# Patient Record
Sex: Male | Born: 1945 | Race: White | Hispanic: No | Marital: Married | Smoking: Former smoker
Health system: Southern US, Community
[De-identification: ages and names within clinical notes are randomized; demographics above are authoritative.]

## PROBLEM LIST (undated history)

## (undated) DIAGNOSIS — C801 Malignant (primary) neoplasm, unspecified: Secondary | ICD-10-CM

## (undated) DIAGNOSIS — H269 Unspecified cataract: Secondary | ICD-10-CM

## (undated) DIAGNOSIS — C189 Malignant neoplasm of colon, unspecified: Secondary | ICD-10-CM

## (undated) DIAGNOSIS — K469 Unspecified abdominal hernia without obstruction or gangrene: Secondary | ICD-10-CM

## (undated) DIAGNOSIS — C3490 Malignant neoplasm of unspecified part of unspecified bronchus or lung: Secondary | ICD-10-CM

## (undated) HISTORY — PX: TONSILLECTOMY: SHX5217

## (undated) HISTORY — PX: COLON SURGERY: SHX602

## (undated) HISTORY — DX: Unspecified cataract: H26.9

## (undated) HISTORY — PX: KNEE ARTHROSCOPY W/ MENISCAL REPAIR: SHX1877

## (undated) HISTORY — PX: COLOSTOMY: SHX63

## (undated) HISTORY — DX: Malignant neoplasm of colon, unspecified: C18.9

## (undated) HISTORY — PX: WISDOM TOOTH EXTRACTION: SHX21

## (undated) HISTORY — DX: Unspecified abdominal hernia without obstruction or gangrene: K46.9

## (undated) HISTORY — DX: Malignant neoplasm of unspecified part of unspecified bronchus or lung: C34.90

---
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
## 2001-08-03 HISTORY — PX: COLOSTOMY: SHX63

## 2001-08-03 HISTORY — PX: COLON SURGERY: SHX602

## 2001-08-18 ENCOUNTER — Ambulatory Visit (HOSPITAL_COMMUNITY): Admission: RE | Admit: 2001-08-18 | Discharge: 2001-08-18 | Payer: Self-pay | Admitting: Gastroenterology

## 2001-08-18 ENCOUNTER — Encounter: Payer: Self-pay | Admitting: Gastroenterology

## 2001-08-24 ENCOUNTER — Encounter: Payer: Self-pay | Admitting: General Surgery

## 2001-08-25 ENCOUNTER — Ambulatory Visit (HOSPITAL_COMMUNITY): Admission: RE | Admit: 2001-08-25 | Discharge: 2001-08-25 | Payer: Self-pay | Admitting: General Surgery

## 2004-06-20 ENCOUNTER — Ambulatory Visit: Payer: Self-pay | Admitting: Internal Medicine

## 2009-10-11 ENCOUNTER — Encounter: Payer: Self-pay | Admitting: Internal Medicine

## 2010-09-02 NOTE — Letter (Signed)
Summary: [facility]  [facility]   Imported By: Lanelle Bal 10/21/2009 14:06:46  _____________________________________________________________________  External Attachment:    Type:   Image     Comment:   External Document

## 2012-05-14 ENCOUNTER — Ambulatory Visit: Payer: BC Managed Care – PPO

## 2012-05-14 ENCOUNTER — Ambulatory Visit (INDEPENDENT_AMBULATORY_CARE_PROVIDER_SITE_OTHER): Payer: BC Managed Care – PPO | Admitting: Internal Medicine

## 2012-05-14 VITALS — BP 115/80 | HR 80 | Temp 97.9°F | Resp 18 | Wt 138.0 lb

## 2012-05-14 DIAGNOSIS — M545 Low back pain, unspecified: Secondary | ICD-10-CM

## 2012-05-14 DIAGNOSIS — M25552 Pain in left hip: Secondary | ICD-10-CM

## 2012-05-14 DIAGNOSIS — D649 Anemia, unspecified: Secondary | ICD-10-CM

## 2012-05-14 DIAGNOSIS — C189 Malignant neoplasm of colon, unspecified: Secondary | ICD-10-CM | POA: Insufficient documentation

## 2012-05-14 DIAGNOSIS — M25559 Pain in unspecified hip: Secondary | ICD-10-CM

## 2012-05-14 DIAGNOSIS — C3490 Malignant neoplasm of unspecified part of unspecified bronchus or lung: Secondary | ICD-10-CM | POA: Insufficient documentation

## 2012-05-14 LAB — POCT CBC
Granulocyte percent: 63.3 %G (ref 37–80)
HCT, POC: 42.8 % — AB (ref 43.5–53.7)
Hemoglobin: 13.1 g/dL — AB (ref 14.1–18.1)
Lymph, poc: 2.2 (ref 0.6–3.4)
MCH, POC: 29 pg (ref 27–31.2)
MCHC: 30.6 g/dL — AB (ref 31.8–35.4)
MCV: 94.8 fL (ref 80–97)
MID (cbc): 0.6 (ref 0–0.9)
MPV: 7.3 fL (ref 0–99.8)
POC Granulocyte: 4.8 (ref 2–6.9)
POC LYMPH PERCENT: 28.7 %L (ref 10–50)
POC MID %: 8 %M (ref 0–12)
Platelet Count, POC: 424 10*3/uL (ref 142–424)
RBC: 4.51 M/uL — AB (ref 4.69–6.13)
RDW, POC: 15.6 %
WBC: 7.6 10*3/uL (ref 4.6–10.2)

## 2012-05-14 LAB — COMPREHENSIVE METABOLIC PANEL
ALT: 19 U/L (ref 0–53)
AST: 18 U/L (ref 0–37)
Albumin: 4.2 g/dL (ref 3.5–5.2)
Alkaline Phosphatase: 41 U/L (ref 39–117)
BUN: 21 mg/dL (ref 6–23)
CO2: 31 mEq/L (ref 19–32)
Calcium: 9.5 mg/dL (ref 8.4–10.5)
Chloride: 102 mEq/L (ref 96–112)
Creat: 0.92 mg/dL (ref 0.50–1.35)
Glucose, Bld: 84 mg/dL (ref 70–99)
Potassium: 5 mEq/L (ref 3.5–5.3)
Sodium: 140 mEq/L (ref 135–145)
Total Bilirubin: 0.7 mg/dL (ref 0.3–1.2)
Total Protein: 6.7 g/dL (ref 6.0–8.3)

## 2012-05-14 LAB — POCT SEDIMENTATION RATE: POCT SED RATE: 12 mm/hr (ref 0–22)

## 2012-05-14 MED ORDER — CYCLOBENZAPRINE HCL 10 MG PO TABS: 10.0000 mg | ORAL_TABLET | Freq: Every day | ORAL | Status: DC

## 2012-05-14 MED ORDER — HYDROCODONE-ACETAMINOPHEN 5-325 MG PO TABS: ORAL_TABLET | ORAL | Status: DC

## 2012-05-14 NOTE — Progress Notes (Signed)
Subjective:    Patient ID: Jon Wallace, male    DOB: [birth year], 66 y.o.   MRN: [id number]  CC: 66 yo W M with history of colorectal cancer w/ lung mets, now c/o L hip pain with radiation down anterior leg.  HPI Pt. Woke up Tuesday 05/03/12 with anterior thigh pain.  Wed. 10/2 he was treated at [facility] with a 6 day taper of Prednisone and Tramadol.  The tramadol caused vomiting and vivid dreams and the prednisone did not help his pain.    He describes a constant aching pain in the L hip accompanied by occasional radiation down the leg.  The pain started radiating only down his anterior thigh, it has since progressed and travels down his anterior shin.  He rates the pain 5/10, worse with laying down and prevents him from sleeping.  It does not bother him much while standing at work.  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  He has also tried Advil and [employer] without success.  He suspects that playing with his 2 yo grandson over the past 6 weeks might have something to do with this problem, but he mentions his wife disagrees with him. No bowel or bladder symptoms.  PMHx: Colorectal Ca. W/Lung Mets.  (Colorectal Ca. 2003? tx with radiation/Sx/Chemo, Lung Mets 2011? tx w/ R? wedge resection) -His history is vague/he has a permanent colostomy Primary care is Dr. Dewaine Oats     Review of Systems Constitutional: Denies changes in weight, night sweats, temperature intolerance HEENT: Denies sinus congestion, changes in vision/hearing, sore throat Cardiovascular: Denies chest pain Respiratory: Denies SOB, wheezing, trouble breathing GI: Denies changes in urinary habits, pain with urination GU: Denies changes in bowel habits, pt has ostomy.   MSK: POSITIVE-as above/ Denies jt pain elsewhere.  Neuro: Denies weakness or sensory loss.     Objective:   Physical Exam General: Pleasant 66 yo M appears concerned but in good health and no acute distress. Vitals:  Filed Vitals:   05/14/12 0910  BP:  115/80  Pulse: 80  Temp: 97.9 F (36.6 C)  Resp: 18  Heart: regular rate Lungs: No acute respiratory distress, no audible wheezing Abdomen: ostomy present. MSK: Normal bulk and tone. LE strength 5/5 bilaterally.  BACK - no brusing, full ROM without pain, non-tender to palpation.  HIP: No visible deformity, no pain with SI compression bilaterally, symmetric/painless ROM.  L = Tender to palpation inferior to the iliac crest posteriorly with partial reproduction of ant thigh pain.    Neuro: Alert, oriented. CN II-XII grossly IT, LE reflexes 2/4 bilaterally, no sensory deficits.     [facility] reading (PRIMARY) by  Dr. 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 but no lytic lesions either  Results for orders placed in visit on 05/14/12  POCT CBC      Component Value Range   WBC 7.6  4.6 - 10.2 K/uL   Lymph, poc 2.2  0.6 - 3.4   POC LYMPH PERCENT 28.7  10 - 50 %L   MID (cbc) 0.6  0 - 0.9   POC MID % 8.0  0 - 12 %M   POC Granulocyte 4.8  2 - 6.9   Granulocyte percent 63.3  37 - 80 %G   RBC 4.51 (*) 4.69 - 6.13 M/uL   Hemoglobin 13.1 (*) 14.1 - 18.1 g/dL   HCT, POC 11.9 (*) 14.7 - 53.7 %  MCV 94.8  80 - 97 fL   MCH, POC 29.0  27 - 31.2 pg   MCHC 30.6 (*) 31.8 - 35.4 g/dL   RDW, POC 16.1     Platelet Count, POC 424  142 - 424 K/uL   MPV 7.3  0 - 99.8 fL  ESR=12   Assessment & Plan:  #1) Left posterior and lateral hip pain of uncertain etiology With his history it is certainly impaired as to rule out R/O Cancer Mets ( I discussed this with him) Check metabolic profile If all normal he will need further evaluation, at first by orthopedics Will send notes to Dr. Foy Guadalajara for his consideration #2 Mild normocytic anemia Fe TIBC B12 Folate Meds ordered this encounter  Medications  . cyclobenzaprine (FLEXERIL) 10 MG tablet    Sig: Take 1 tablet (10 mg total) by mouth at bedtime.     Dispense:  30 tablet    Refill:  0  . HYDROcodone-acetaminophen (NORCO) 5-325 MG per tablet    Sig: 1-2 at bedtime if needed for pain    Dispense:  30 tablet    Refill:  0        This is mainly to ensure that he gets some sleep during the time that we evaluate this problem  He'll also use 800 mg of ibuprofen every 8 hours when necessary  OV

## 2017-02-08 DIAGNOSIS — N529 Male erectile dysfunction, unspecified: Secondary | ICD-10-CM | POA: Diagnosis not present

## 2017-02-08 DIAGNOSIS — D649 Anemia, unspecified: Secondary | ICD-10-CM | POA: Diagnosis not present

## 2017-02-08 DIAGNOSIS — E7800 Pure hypercholesterolemia, unspecified: Secondary | ICD-10-CM | POA: Diagnosis not present

## 2017-02-08 DIAGNOSIS — E559 Vitamin D deficiency, unspecified: Secondary | ICD-10-CM | POA: Diagnosis not present

## 2017-02-19 DIAGNOSIS — Z808 Family history of malignant neoplasm of other organs or systems: Secondary | ICD-10-CM | POA: Diagnosis not present

## 2017-02-19 DIAGNOSIS — Z6821 Body mass index (BMI) 21.0-21.9, adult: Secondary | ICD-10-CM | POA: Diagnosis not present

## 2017-02-19 DIAGNOSIS — Z7982 Long term (current) use of aspirin: Secondary | ICD-10-CM | POA: Diagnosis not present

## 2017-02-19 DIAGNOSIS — Z923 Personal history of irradiation: Secondary | ICD-10-CM | POA: Diagnosis not present

## 2017-02-19 DIAGNOSIS — C20 Malignant neoplasm of rectum: Secondary | ICD-10-CM | POA: Diagnosis not present

## 2017-02-19 DIAGNOSIS — Z85048 Personal history of other malignant neoplasm of rectum, rectosigmoid junction, and anus: Secondary | ICD-10-CM | POA: Diagnosis not present

## 2017-02-19 DIAGNOSIS — Z8371 Family history of colonic polyps: Secondary | ICD-10-CM | POA: Diagnosis not present

## 2017-02-19 DIAGNOSIS — R911 Solitary pulmonary nodule: Secondary | ICD-10-CM | POA: Diagnosis not present

## 2017-02-19 DIAGNOSIS — Z9221 Personal history of antineoplastic chemotherapy: Secondary | ICD-10-CM | POA: Diagnosis not present

## 2017-02-19 DIAGNOSIS — Z933 Colostomy status: Secondary | ICD-10-CM | POA: Diagnosis not present

## 2017-02-19 DIAGNOSIS — Z08 Encounter for follow-up examination after completed treatment for malignant neoplasm: Secondary | ICD-10-CM | POA: Diagnosis not present

## 2017-02-19 DIAGNOSIS — Z885 Allergy status to narcotic agent status: Secondary | ICD-10-CM | POA: Diagnosis not present

## 2017-02-19 DIAGNOSIS — C7800 Secondary malignant neoplasm of unspecified lung: Secondary | ICD-10-CM | POA: Diagnosis not present

## 2017-02-19 DIAGNOSIS — Z87891 Personal history of nicotine dependence: Secondary | ICD-10-CM | POA: Diagnosis not present

## 2017-04-12 DIAGNOSIS — Z23 Encounter for immunization: Secondary | ICD-10-CM | POA: Diagnosis not present

## 2017-08-16 DIAGNOSIS — R6882 Decreased libido: Secondary | ICD-10-CM | POA: Diagnosis not present

## 2017-08-16 DIAGNOSIS — E7800 Pure hypercholesterolemia, unspecified: Secondary | ICD-10-CM | POA: Diagnosis not present

## 2017-08-16 DIAGNOSIS — D500 Iron deficiency anemia secondary to blood loss (chronic): Secondary | ICD-10-CM | POA: Diagnosis not present

## 2017-08-16 DIAGNOSIS — Z933 Colostomy status: Secondary | ICD-10-CM | POA: Diagnosis not present

## 2017-08-16 DIAGNOSIS — Z85038 Personal history of other malignant neoplasm of large intestine: Secondary | ICD-10-CM | POA: Diagnosis not present

## 2017-08-16 DIAGNOSIS — Z0000 Encounter for general adult medical examination without abnormal findings: Secondary | ICD-10-CM | POA: Diagnosis not present

## 2017-08-16 DIAGNOSIS — N529 Male erectile dysfunction, unspecified: Secondary | ICD-10-CM | POA: Diagnosis not present

## 2017-08-16 DIAGNOSIS — E559 Vitamin D deficiency, unspecified: Secondary | ICD-10-CM | POA: Diagnosis not present

## 2017-09-14 ENCOUNTER — Ambulatory Visit (INDEPENDENT_AMBULATORY_CARE_PROVIDER_SITE_OTHER): Payer: Medicare Other | Admitting: Psychiatry

## 2017-09-14 DIAGNOSIS — F411 Generalized anxiety disorder: Secondary | ICD-10-CM

## 2017-10-05 ENCOUNTER — Ambulatory Visit: Payer: Medicare Other | Admitting: Psychiatry

## 2017-10-06 ENCOUNTER — Ambulatory Visit: Payer: Medicare Other | Admitting: Psychiatry

## 2017-10-21 DIAGNOSIS — Z885 Allergy status to narcotic agent status: Secondary | ICD-10-CM | POA: Diagnosis not present

## 2017-10-21 DIAGNOSIS — C7889 Secondary malignant neoplasm of other digestive organs: Secondary | ICD-10-CM | POA: Diagnosis not present

## 2017-10-21 DIAGNOSIS — R911 Solitary pulmonary nodule: Secondary | ICD-10-CM | POA: Diagnosis not present

## 2017-10-21 DIAGNOSIS — Z933 Colostomy status: Secondary | ICD-10-CM | POA: Diagnosis not present

## 2017-10-21 DIAGNOSIS — Z8249 Family history of ischemic heart disease and other diseases of the circulatory system: Secondary | ICD-10-CM | POA: Diagnosis not present

## 2017-10-21 DIAGNOSIS — Z08 Encounter for follow-up examination after completed treatment for malignant neoplasm: Secondary | ICD-10-CM | POA: Diagnosis not present

## 2017-10-21 DIAGNOSIS — Z7982 Long term (current) use of aspirin: Secondary | ICD-10-CM | POA: Diagnosis not present

## 2017-10-21 DIAGNOSIS — R918 Other nonspecific abnormal finding of lung field: Secondary | ICD-10-CM | POA: Diagnosis not present

## 2017-10-21 DIAGNOSIS — Z8371 Family history of colonic polyps: Secondary | ICD-10-CM | POA: Diagnosis not present

## 2017-10-21 DIAGNOSIS — K56699 Other intestinal obstruction unspecified as to partial versus complete obstruction: Secondary | ICD-10-CM | POA: Diagnosis not present

## 2017-10-21 DIAGNOSIS — Z808 Family history of malignant neoplasm of other organs or systems: Secondary | ICD-10-CM | POA: Diagnosis not present

## 2017-10-21 DIAGNOSIS — Z9221 Personal history of antineoplastic chemotherapy: Secondary | ICD-10-CM | POA: Diagnosis not present

## 2017-10-21 DIAGNOSIS — Z923 Personal history of irradiation: Secondary | ICD-10-CM | POA: Diagnosis not present

## 2017-10-21 DIAGNOSIS — Z8719 Personal history of other diseases of the digestive system: Secondary | ICD-10-CM | POA: Diagnosis not present

## 2017-10-21 DIAGNOSIS — Z6821 Body mass index (BMI) 21.0-21.9, adult: Secondary | ICD-10-CM | POA: Diagnosis not present

## 2017-10-21 DIAGNOSIS — Z85048 Personal history of other malignant neoplasm of rectum, rectosigmoid junction, and anus: Secondary | ICD-10-CM | POA: Diagnosis not present

## 2017-10-21 DIAGNOSIS — C20 Malignant neoplasm of rectum: Secondary | ICD-10-CM | POA: Diagnosis not present

## 2017-10-21 DIAGNOSIS — Z85118 Personal history of other malignant neoplasm of bronchus and lung: Secondary | ICD-10-CM | POA: Diagnosis not present

## 2017-10-21 DIAGNOSIS — Z87891 Personal history of nicotine dependence: Secondary | ICD-10-CM | POA: Diagnosis not present

## 2017-10-21 DIAGNOSIS — Z902 Acquired absence of lung [part of]: Secondary | ICD-10-CM | POA: Diagnosis not present

## 2017-10-21 DIAGNOSIS — K56600 Partial intestinal obstruction, unspecified as to cause: Secondary | ICD-10-CM | POA: Diagnosis not present

## 2017-10-21 DIAGNOSIS — C7800 Secondary malignant neoplasm of unspecified lung: Secondary | ICD-10-CM | POA: Diagnosis not present

## 2017-11-02 DIAGNOSIS — R05 Cough: Secondary | ICD-10-CM | POA: Diagnosis not present

## 2017-11-02 DIAGNOSIS — J069 Acute upper respiratory infection, unspecified: Secondary | ICD-10-CM | POA: Diagnosis not present

## 2017-11-18 DIAGNOSIS — J209 Acute bronchitis, unspecified: Secondary | ICD-10-CM | POA: Diagnosis not present

## 2017-11-18 DIAGNOSIS — J302 Other seasonal allergic rhinitis: Secondary | ICD-10-CM | POA: Diagnosis not present

## 2017-11-18 DIAGNOSIS — R05 Cough: Secondary | ICD-10-CM | POA: Diagnosis not present

## 2017-12-02 DIAGNOSIS — R05 Cough: Secondary | ICD-10-CM | POA: Diagnosis not present

## 2017-12-02 DIAGNOSIS — J302 Other seasonal allergic rhinitis: Secondary | ICD-10-CM | POA: Diagnosis not present

## 2017-12-02 DIAGNOSIS — J209 Acute bronchitis, unspecified: Secondary | ICD-10-CM | POA: Diagnosis not present

## 2017-12-04 DIAGNOSIS — Z7982 Long term (current) use of aspirin: Secondary | ICD-10-CM | POA: Diagnosis not present

## 2017-12-04 DIAGNOSIS — K56600 Partial intestinal obstruction, unspecified as to cause: Secondary | ICD-10-CM | POA: Diagnosis not present

## 2017-12-04 DIAGNOSIS — Z85048 Personal history of other malignant neoplasm of rectum, rectosigmoid junction, and anus: Secondary | ICD-10-CM | POA: Diagnosis not present

## 2017-12-04 DIAGNOSIS — M545 Low back pain: Secondary | ICD-10-CM | POA: Diagnosis not present

## 2017-12-04 DIAGNOSIS — Z933 Colostomy status: Secondary | ICD-10-CM | POA: Diagnosis not present

## 2017-12-04 DIAGNOSIS — C20 Malignant neoplasm of rectum: Secondary | ICD-10-CM | POA: Diagnosis not present

## 2017-12-04 DIAGNOSIS — R1031 Right lower quadrant pain: Secondary | ICD-10-CM | POA: Diagnosis not present

## 2017-12-04 DIAGNOSIS — K5651 Intestinal adhesions [bands], with partial obstruction: Secondary | ICD-10-CM | POA: Diagnosis present

## 2017-12-04 DIAGNOSIS — Z6820 Body mass index (BMI) 20.0-20.9, adult: Secondary | ICD-10-CM | POA: Diagnosis not present

## 2017-12-04 DIAGNOSIS — R1033 Periumbilical pain: Secondary | ICD-10-CM | POA: Diagnosis not present

## 2017-12-04 DIAGNOSIS — Z87891 Personal history of nicotine dependence: Secondary | ICD-10-CM | POA: Diagnosis not present

## 2017-12-04 DIAGNOSIS — C7800 Secondary malignant neoplasm of unspecified lung: Secondary | ICD-10-CM | POA: Diagnosis not present

## 2017-12-04 DIAGNOSIS — Z885 Allergy status to narcotic agent status: Secondary | ICD-10-CM | POA: Diagnosis not present

## 2017-12-04 DIAGNOSIS — Z85118 Personal history of other malignant neoplasm of bronchus and lung: Secondary | ICD-10-CM | POA: Diagnosis not present

## 2018-01-04 DIAGNOSIS — Z6820 Body mass index (BMI) 20.0-20.9, adult: Secondary | ICD-10-CM | POA: Diagnosis not present

## 2018-01-04 DIAGNOSIS — K56600 Partial intestinal obstruction, unspecified as to cause: Secondary | ICD-10-CM | POA: Diagnosis not present

## 2018-01-12 DIAGNOSIS — R933 Abnormal findings on diagnostic imaging of other parts of digestive tract: Secondary | ICD-10-CM | POA: Diagnosis not present

## 2018-01-12 DIAGNOSIS — Z85048 Personal history of other malignant neoplasm of rectum, rectosigmoid junction, and anus: Secondary | ICD-10-CM | POA: Diagnosis not present

## 2018-01-12 DIAGNOSIS — Z1211 Encounter for screening for malignant neoplasm of colon: Secondary | ICD-10-CM | POA: Diagnosis not present

## 2018-01-12 DIAGNOSIS — Z933 Colostomy status: Secondary | ICD-10-CM | POA: Diagnosis not present

## 2018-01-12 DIAGNOSIS — R112 Nausea with vomiting, unspecified: Secondary | ICD-10-CM | POA: Diagnosis not present

## 2018-01-12 DIAGNOSIS — Z7982 Long term (current) use of aspirin: Secondary | ICD-10-CM | POA: Diagnosis not present

## 2018-01-19 DIAGNOSIS — Z933 Colostomy status: Secondary | ICD-10-CM | POA: Diagnosis not present

## 2018-01-19 DIAGNOSIS — K5901 Slow transit constipation: Secondary | ICD-10-CM | POA: Diagnosis not present

## 2018-01-19 DIAGNOSIS — K56699 Other intestinal obstruction unspecified as to partial versus complete obstruction: Secondary | ICD-10-CM | POA: Diagnosis not present

## 2018-01-19 DIAGNOSIS — Z8719 Personal history of other diseases of the digestive system: Secondary | ICD-10-CM | POA: Diagnosis not present

## 2018-02-14 DIAGNOSIS — Z933 Colostomy status: Secondary | ICD-10-CM | POA: Diagnosis not present

## 2018-02-14 DIAGNOSIS — K5901 Slow transit constipation: Secondary | ICD-10-CM | POA: Diagnosis not present

## 2018-02-14 DIAGNOSIS — E559 Vitamin D deficiency, unspecified: Secondary | ICD-10-CM | POA: Diagnosis not present

## 2018-02-14 DIAGNOSIS — Z8719 Personal history of other diseases of the digestive system: Secondary | ICD-10-CM | POA: Diagnosis not present

## 2018-02-14 DIAGNOSIS — K56699 Other intestinal obstruction unspecified as to partial versus complete obstruction: Secondary | ICD-10-CM | POA: Diagnosis not present

## 2018-02-14 DIAGNOSIS — E7800 Pure hypercholesterolemia, unspecified: Secondary | ICD-10-CM | POA: Diagnosis not present

## 2018-03-15 DIAGNOSIS — Z6820 Body mass index (BMI) 20.0-20.9, adult: Secondary | ICD-10-CM | POA: Diagnosis not present

## 2018-03-15 DIAGNOSIS — K56600 Partial intestinal obstruction, unspecified as to cause: Secondary | ICD-10-CM | POA: Diagnosis not present

## 2018-04-20 DIAGNOSIS — Z79899 Other long term (current) drug therapy: Secondary | ICD-10-CM | POA: Diagnosis not present

## 2018-04-20 DIAGNOSIS — Z1211 Encounter for screening for malignant neoplasm of colon: Secondary | ICD-10-CM | POA: Diagnosis not present

## 2018-04-20 DIAGNOSIS — Z7982 Long term (current) use of aspirin: Secondary | ICD-10-CM | POA: Diagnosis not present

## 2018-04-20 DIAGNOSIS — D124 Benign neoplasm of descending colon: Secondary | ICD-10-CM | POA: Diagnosis not present

## 2018-04-20 DIAGNOSIS — Z85038 Personal history of other malignant neoplasm of large intestine: Secondary | ICD-10-CM | POA: Diagnosis not present

## 2018-04-20 DIAGNOSIS — Z85118 Personal history of other malignant neoplasm of bronchus and lung: Secondary | ICD-10-CM | POA: Diagnosis not present

## 2018-04-20 DIAGNOSIS — K635 Polyp of colon: Secondary | ICD-10-CM | POA: Diagnosis not present

## 2018-04-28 ENCOUNTER — Ambulatory Visit (INDEPENDENT_AMBULATORY_CARE_PROVIDER_SITE_OTHER): Payer: Medicare Other | Admitting: Psychology

## 2018-04-28 DIAGNOSIS — F419 Anxiety disorder, unspecified: Secondary | ICD-10-CM

## 2018-05-06 DIAGNOSIS — Z23 Encounter for immunization: Secondary | ICD-10-CM | POA: Diagnosis not present

## 2018-06-07 ENCOUNTER — Ambulatory Visit (INDEPENDENT_AMBULATORY_CARE_PROVIDER_SITE_OTHER): Payer: Medicare Other | Admitting: Psychology

## 2018-06-07 DIAGNOSIS — F419 Anxiety disorder, unspecified: Secondary | ICD-10-CM | POA: Diagnosis not present

## 2018-06-17 DIAGNOSIS — Z933 Colostomy status: Secondary | ICD-10-CM | POA: Diagnosis not present

## 2018-06-17 DIAGNOSIS — E559 Vitamin D deficiency, unspecified: Secondary | ICD-10-CM | POA: Diagnosis not present

## 2018-06-17 DIAGNOSIS — E7800 Pure hypercholesterolemia, unspecified: Secondary | ICD-10-CM | POA: Diagnosis not present

## 2018-06-17 DIAGNOSIS — Z8719 Personal history of other diseases of the digestive system: Secondary | ICD-10-CM | POA: Diagnosis not present

## 2018-06-17 DIAGNOSIS — K56699 Other intestinal obstruction unspecified as to partial versus complete obstruction: Secondary | ICD-10-CM | POA: Diagnosis not present

## 2018-06-17 DIAGNOSIS — K5901 Slow transit constipation: Secondary | ICD-10-CM | POA: Diagnosis not present

## 2018-06-21 ENCOUNTER — Ambulatory Visit (INDEPENDENT_AMBULATORY_CARE_PROVIDER_SITE_OTHER): Payer: Medicare Other | Admitting: Psychology

## 2018-06-21 DIAGNOSIS — F419 Anxiety disorder, unspecified: Secondary | ICD-10-CM | POA: Diagnosis not present

## 2018-07-05 ENCOUNTER — Ambulatory Visit (INDEPENDENT_AMBULATORY_CARE_PROVIDER_SITE_OTHER): Payer: Medicare Other | Admitting: Psychology

## 2018-07-05 DIAGNOSIS — F419 Anxiety disorder, unspecified: Secondary | ICD-10-CM | POA: Diagnosis not present

## 2018-08-18 DIAGNOSIS — Z0000 Encounter for general adult medical examination without abnormal findings: Secondary | ICD-10-CM | POA: Diagnosis not present

## 2018-08-18 DIAGNOSIS — Z8719 Personal history of other diseases of the digestive system: Secondary | ICD-10-CM | POA: Diagnosis not present

## 2018-08-18 DIAGNOSIS — K56699 Other intestinal obstruction unspecified as to partial versus complete obstruction: Secondary | ICD-10-CM | POA: Diagnosis not present

## 2018-08-18 DIAGNOSIS — Z933 Colostomy status: Secondary | ICD-10-CM | POA: Diagnosis not present

## 2018-08-18 DIAGNOSIS — K5901 Slow transit constipation: Secondary | ICD-10-CM | POA: Diagnosis not present

## 2018-08-18 DIAGNOSIS — D649 Anemia, unspecified: Secondary | ICD-10-CM | POA: Diagnosis not present

## 2018-08-18 DIAGNOSIS — E7800 Pure hypercholesterolemia, unspecified: Secondary | ICD-10-CM | POA: Diagnosis not present

## 2018-08-18 DIAGNOSIS — E559 Vitamin D deficiency, unspecified: Secondary | ICD-10-CM | POA: Diagnosis not present

## 2018-12-13 DIAGNOSIS — K56609 Unspecified intestinal obstruction, unspecified as to partial versus complete obstruction: Secondary | ICD-10-CM | POA: Diagnosis not present

## 2018-12-13 DIAGNOSIS — Z85038 Personal history of other malignant neoplasm of large intestine: Secondary | ICD-10-CM | POA: Diagnosis not present

## 2019-01-06 DIAGNOSIS — K56609 Unspecified intestinal obstruction, unspecified as to partial versus complete obstruction: Secondary | ICD-10-CM | POA: Diagnosis not present

## 2019-03-28 DIAGNOSIS — Z8719 Personal history of other diseases of the digestive system: Secondary | ICD-10-CM | POA: Insufficient documentation

## 2019-04-14 DIAGNOSIS — Z01812 Encounter for preprocedural laboratory examination: Secondary | ICD-10-CM | POA: Diagnosis not present

## 2019-04-14 DIAGNOSIS — Z8719 Personal history of other diseases of the digestive system: Secondary | ICD-10-CM | POA: Diagnosis not present

## 2019-04-14 DIAGNOSIS — Z20828 Contact with and (suspected) exposure to other viral communicable diseases: Secondary | ICD-10-CM | POA: Diagnosis not present

## 2019-04-20 DIAGNOSIS — K9409 Other complications of colostomy: Secondary | ICD-10-CM | POA: Diagnosis not present

## 2019-04-20 DIAGNOSIS — K5651 Intestinal adhesions [bands], with partial obstruction: Secondary | ICD-10-CM | POA: Diagnosis present

## 2019-04-20 DIAGNOSIS — K660 Peritoneal adhesions (postprocedural) (postinfection): Secondary | ICD-10-CM | POA: Diagnosis not present

## 2019-04-20 DIAGNOSIS — Z433 Encounter for attention to colostomy: Secondary | ICD-10-CM | POA: Diagnosis not present

## 2019-04-20 DIAGNOSIS — Z9889 Other specified postprocedural states: Secondary | ICD-10-CM | POA: Diagnosis not present

## 2019-04-20 DIAGNOSIS — Z85048 Personal history of other malignant neoplasm of rectum, rectosigmoid junction, and anus: Secondary | ICD-10-CM | POA: Diagnosis not present

## 2019-04-20 DIAGNOSIS — Z87891 Personal history of nicotine dependence: Secondary | ICD-10-CM | POA: Diagnosis not present

## 2019-04-20 DIAGNOSIS — K9403 Colostomy malfunction: Secondary | ICD-10-CM | POA: Diagnosis not present

## 2019-04-20 DIAGNOSIS — K5669 Other partial intestinal obstruction: Secondary | ICD-10-CM | POA: Diagnosis not present

## 2019-04-20 DIAGNOSIS — G8918 Other acute postprocedural pain: Secondary | ICD-10-CM | POA: Diagnosis not present

## 2019-04-20 DIAGNOSIS — Z8719 Personal history of other diseases of the digestive system: Secondary | ICD-10-CM | POA: Diagnosis not present

## 2019-04-23 MED ORDER — CVS EAR DROPS OT
40.00 | OTIC | Status: DC
Start: 2019-04-24 — End: 2019-04-23

## 2019-04-23 MED ORDER — OXYCODONE HCL 5 MG PO TABS
5.00 | ORAL_TABLET | ORAL | Status: DC
Start: ? — End: 2019-04-23

## 2019-04-23 MED ORDER — QUINERVA 260 MG PO TABS
650.00 | ORAL_TABLET | ORAL | Status: DC
Start: 2019-04-23 — End: 2019-04-23

## 2019-04-23 MED ORDER — TRAMADOL HCL 50 MG PO TABS
50.00 | ORAL_TABLET | ORAL | Status: DC
Start: ? — End: 2019-04-23

## 2019-04-23 MED ORDER — WRIST SPLINT/RIGHT YOUTH MISC
75.00 | Status: DC
Start: 2019-04-23 — End: 2019-04-23

## 2019-04-23 MED ORDER — EQUATE NICOTINE 4 MG MT GUM
4.00 | CHEWING_GUM | OROMUCOSAL | Status: DC
Start: ? — End: 2019-04-23

## 2019-05-04 DIAGNOSIS — Z9889 Other specified postprocedural states: Secondary | ICD-10-CM | POA: Diagnosis not present

## 2019-05-04 DIAGNOSIS — Z09 Encounter for follow-up examination after completed treatment for conditions other than malignant neoplasm: Secondary | ICD-10-CM | POA: Diagnosis not present

## 2019-05-04 DIAGNOSIS — D500 Iron deficiency anemia secondary to blood loss (chronic): Secondary | ICD-10-CM | POA: Diagnosis not present

## 2019-05-04 DIAGNOSIS — Z85038 Personal history of other malignant neoplasm of large intestine: Secondary | ICD-10-CM | POA: Diagnosis not present

## 2019-05-04 DIAGNOSIS — E559 Vitamin D deficiency, unspecified: Secondary | ICD-10-CM | POA: Diagnosis not present

## 2019-05-16 DIAGNOSIS — Z48815 Encounter for surgical aftercare following surgery on the digestive system: Secondary | ICD-10-CM | POA: Diagnosis not present

## 2019-08-04 HISTORY — PX: COLON SURGERY: SHX602

## 2019-08-24 DIAGNOSIS — D500 Iron deficiency anemia secondary to blood loss (chronic): Secondary | ICD-10-CM | POA: Diagnosis not present

## 2019-08-24 DIAGNOSIS — E559 Vitamin D deficiency, unspecified: Secondary | ICD-10-CM | POA: Diagnosis not present

## 2019-08-24 DIAGNOSIS — Z125 Encounter for screening for malignant neoplasm of prostate: Secondary | ICD-10-CM | POA: Diagnosis not present

## 2019-08-24 DIAGNOSIS — Z0000 Encounter for general adult medical examination without abnormal findings: Secondary | ICD-10-CM | POA: Diagnosis not present

## 2019-08-24 DIAGNOSIS — Z8719 Personal history of other diseases of the digestive system: Secondary | ICD-10-CM | POA: Diagnosis not present

## 2019-08-24 DIAGNOSIS — Z85038 Personal history of other malignant neoplasm of large intestine: Secondary | ICD-10-CM | POA: Diagnosis not present

## 2019-11-14 DIAGNOSIS — C20 Malignant neoplasm of rectum: Secondary | ICD-10-CM | POA: Diagnosis not present

## 2019-11-14 DIAGNOSIS — Z09 Encounter for follow-up examination after completed treatment for conditions other than malignant neoplasm: Secondary | ICD-10-CM | POA: Diagnosis not present

## 2019-11-14 DIAGNOSIS — Z8719 Personal history of other diseases of the digestive system: Secondary | ICD-10-CM | POA: Diagnosis not present

## 2019-11-14 DIAGNOSIS — Z85038 Personal history of other malignant neoplasm of large intestine: Secondary | ICD-10-CM | POA: Diagnosis not present

## 2019-11-14 DIAGNOSIS — C7800 Secondary malignant neoplasm of unspecified lung: Secondary | ICD-10-CM | POA: Diagnosis not present

## 2019-11-16 DIAGNOSIS — Z23 Encounter for immunization: Secondary | ICD-10-CM | POA: Diagnosis not present

## 2019-12-07 DIAGNOSIS — Z23 Encounter for immunization: Secondary | ICD-10-CM | POA: Diagnosis not present

## 2019-12-26 DIAGNOSIS — H04123 Dry eye syndrome of bilateral lacrimal glands: Secondary | ICD-10-CM | POA: Diagnosis not present

## 2019-12-26 DIAGNOSIS — H43811 Vitreous degeneration, right eye: Secondary | ICD-10-CM | POA: Diagnosis not present

## 2019-12-26 DIAGNOSIS — H2513 Age-related nuclear cataract, bilateral: Secondary | ICD-10-CM | POA: Diagnosis not present

## 2019-12-26 DIAGNOSIS — H10413 Chronic giant papillary conjunctivitis, bilateral: Secondary | ICD-10-CM | POA: Diagnosis not present

## 2019-12-26 DIAGNOSIS — H35371 Puckering of macula, right eye: Secondary | ICD-10-CM | POA: Diagnosis not present

## 2019-12-26 DIAGNOSIS — H353121 Nonexudative age-related macular degeneration, left eye, early dry stage: Secondary | ICD-10-CM | POA: Diagnosis not present

## 2019-12-26 DIAGNOSIS — H43392 Other vitreous opacities, left eye: Secondary | ICD-10-CM | POA: Diagnosis not present

## 2020-11-27 DIAGNOSIS — Z85048 Personal history of other malignant neoplasm of rectum, rectosigmoid junction, and anus: Secondary | ICD-10-CM | POA: Diagnosis not present

## 2020-11-29 DIAGNOSIS — Z03818 Encounter for observation for suspected exposure to other biological agents ruled out: Secondary | ICD-10-CM | POA: Diagnosis not present

## 2020-11-29 DIAGNOSIS — J069 Acute upper respiratory infection, unspecified: Secondary | ICD-10-CM | POA: Diagnosis not present

## 2020-12-10 DIAGNOSIS — Z1211 Encounter for screening for malignant neoplasm of colon: Secondary | ICD-10-CM | POA: Diagnosis not present

## 2020-12-10 DIAGNOSIS — K635 Polyp of colon: Secondary | ICD-10-CM | POA: Diagnosis not present

## 2020-12-10 DIAGNOSIS — Z01818 Encounter for other preprocedural examination: Secondary | ICD-10-CM | POA: Diagnosis not present

## 2020-12-13 DIAGNOSIS — K635 Polyp of colon: Secondary | ICD-10-CM | POA: Diagnosis not present

## 2020-12-26 ENCOUNTER — Encounter (INDEPENDENT_AMBULATORY_CARE_PROVIDER_SITE_OTHER): Payer: Self-pay

## 2020-12-26 DIAGNOSIS — H43392 Other vitreous opacities, left eye: Secondary | ICD-10-CM | POA: Diagnosis not present

## 2020-12-26 DIAGNOSIS — H04123 Dry eye syndrome of bilateral lacrimal glands: Secondary | ICD-10-CM | POA: Diagnosis not present

## 2020-12-26 DIAGNOSIS — H43811 Vitreous degeneration, right eye: Secondary | ICD-10-CM | POA: Diagnosis not present

## 2020-12-26 DIAGNOSIS — H10413 Chronic giant papillary conjunctivitis, bilateral: Secondary | ICD-10-CM | POA: Diagnosis not present

## 2020-12-26 DIAGNOSIS — H2513 Age-related nuclear cataract, bilateral: Secondary | ICD-10-CM | POA: Diagnosis not present

## 2020-12-26 DIAGNOSIS — H35371 Puckering of macula, right eye: Secondary | ICD-10-CM | POA: Diagnosis not present

## 2020-12-26 DIAGNOSIS — H353121 Nonexudative age-related macular degeneration, left eye, early dry stage: Secondary | ICD-10-CM | POA: Diagnosis not present

## 2021-01-07 ENCOUNTER — Other Ambulatory Visit: Payer: Self-pay

## 2021-01-07 ENCOUNTER — Ambulatory Visit (INDEPENDENT_AMBULATORY_CARE_PROVIDER_SITE_OTHER): Payer: Medicare Other | Admitting: Ophthalmology

## 2021-01-07 ENCOUNTER — Encounter (INDEPENDENT_AMBULATORY_CARE_PROVIDER_SITE_OTHER): Payer: Self-pay | Admitting: Ophthalmology

## 2021-01-07 DIAGNOSIS — H43812 Vitreous degeneration, left eye: Secondary | ICD-10-CM | POA: Insufficient documentation

## 2021-01-07 DIAGNOSIS — H35371 Puckering of macula, right eye: Secondary | ICD-10-CM | POA: Diagnosis not present

## 2021-01-07 DIAGNOSIS — H43813 Vitreous degeneration, bilateral: Secondary | ICD-10-CM | POA: Diagnosis not present

## 2021-01-07 DIAGNOSIS — H2512 Age-related nuclear cataract, left eye: Secondary | ICD-10-CM | POA: Insufficient documentation

## 2021-01-07 DIAGNOSIS — H2513 Age-related nuclear cataract, bilateral: Secondary | ICD-10-CM

## 2021-01-07 NOTE — Assessment & Plan Note (Signed)
Minor OU no retinal holes or tears, physiologic condition OU

## 2021-01-07 NOTE — Assessment & Plan Note (Signed)
Mild not visually significant cataract.

## 2021-01-07 NOTE — Assessment & Plan Note (Signed)
The nature of macular pucker (epiretinal membrane ERM) was discussed with the patient as well as threshold criteria for vitrectomy surgery. I explained that in rare cases another surgery is needed to actually remove a second wrinkle should it regrow.  Most often, the epiretinal membrane and underlying wrinkled internal limiting membrane are removed with the first surgery, to accomplish the goals.   If the operative eye is Phakic (natural lens still present), cataract surgery is often recommended prior to Vitrectomy. This will enable the retina surgeon to have the best view during surgery and the patient to obtain optimal results in the future. Treatment options were discussed.  Monocular vision testing discussed with the patient particularly to use reading materials in the monocular fashion to compare right eye versus left eye and to determine when or if visual acuity impact develops from this epiretinal membrane.  I did explain to the patient that should epiretinal membrane progress in the right eye or symptoms develop, it is most likely that cataract surgery in that eye will be required first to clear the visual axis and to determine the full impact of the epiretinal membrane on visual function moreover it would allow potential addressing the epiretinal membrane via surgical intervention should it be required based on his visual symptoms

## 2021-01-07 NOTE — Patient Instructions (Signed)
Macular Pucker  A macular pucker is scarring on the macula of the eye. The macula is the part of the eye that lets you see clearly and sharply. It also lets you see detail. The macula is in the middle of the thin membrane that covers the back of your eye (retina). A macular pucker usually starts in one eye but can affect both eyes over time. This condition can affect your vision. Vision changes can range from mild to severe. These vision changes usually do not get worse over time. What are the causes? This condition is caused by shrinking of the gel-like fluid that fills your eye (vitreous). The vitreous may shrink as you get older. As it shrinks, it pulls on your macula and forms scar tissue. What increases the risk? You are more likely to develop this condition if you are age 77 or older. Certain medical conditions can also increase your risk of macular pucker. These include:  A disease of the retina (retinopathy).  Injury or trauma to the eye.  Nearsightedness (myopia).  Tearing or detachment of the retina.  Inflammation of the eye (uveitis).  Diabetes. What are the signs or symptoms? Symptoms of this condition include:  Blurred vision. You may have trouble reading small print or seeing fine details.  Distorted vision. Straight lines may appear wavy.  A gray area or blind spot in the center of your vision. A mild pucker may cause no symptoms. How is this diagnosed? This condition is diagnosed based on your medical history and a physical exam. You may be referred to a health care provider who specializes in eye conditions (ophthalmologist) for an eye exam. You may also have tests, including tests that involve:  Checking the back of your eye with a microscope.  Injecting dye into the back of your eye before doing an exam.  Taking pictures of the back of your eye. How is this treated? Treatment is not needed for this condition unless your symptoms interfere with your daily  activities. Your eye care provider may change your prescription if you wear eyeglasses to help you see better. If treatment is needed, surgery is the only treatment for macular pucker.  This surgery (vitrectomy) is done only when macular pucker causes vision changes that interfere with your daily activities.  In this procedure, a surgeon removes any scar tissue and replaces your vitreous with a saltwater solution. Follow these instructions at home:  Be aware of any changes in your vision.  Keep all follow-up visits as told by your health care provider. This is important. Contact a health care provider if:  You have any new or worsening vision changes. Summary  A macular pucker is scarring on the macula of the eye.  This condition is caused by shrinking of the gel-like fluid that fills your eye.  A macular pucker can affect your vision. You may have trouble reading small print or seeing fine details.  Treatment is not needed unless the condition interferes with your daily activities. This information is not intended to replace advice given to you by your health care provider. Make sure you discuss any questions you have with your health care provider. Document Revised: 01/11/2018 Document Reviewed: 01/11/2018 Elsevier Patient Education  [place].

## 2021-01-07 NOTE — Progress Notes (Signed)
01/07/2021     CHIEF COMPLAINT Patient presents for Retina Evaluation (NP ERM OD - Ref'd by Dr. Herbie Baltimore Groat//Pt denies visual symptoms OU. Pt denies distortion OU. No complaints OU. Pt has not yet had cataract sx OU.)   HISTORY OF PRESENT ILLNESS: Jon Wallace is a 75 y.o. male who presents to the clinic today for:   HPI    Retina Evaluation    Laterality: right eye   Treatments tried: no treatments   Comments: NP ERM OD - Ref'd by Dr. Clent Jacks  Pt denies visual symptoms OU. Pt denies distortion OU. No complaints OU. Pt has not yet had cataract sx OU.       Last edited by Rockie Neighbours, [place] on 01/07/2021  8:46 AM. (History)      Referring physician: Briscoe Deutscher, MD [address],  [place] [address]  HISTORICAL INFORMATION:   Selected notes from the [facility]: No current outpatient medications on file. (Ophthalmic Drugs)   No current facility-administered medications for this visit. (Ophthalmic Drugs)   Current Outpatient Medications (Other)  Medication Sig  . cyclobenzaprine (FLEXERIL) 10 MG tablet Take 1 tablet (10 mg total) by mouth at bedtime.  Marland Kitchen HYDROcodone-acetaminophen (NORCO) 5-325 MG per tablet 1-2 at bedtime if needed for pain   No current facility-administered medications for this visit. (Other)      REVIEW OF SYSTEMS:    ALLERGIES Allergies  Allergen Reactions  . Morphine And Related     PAST MEDICAL HISTORY History reviewed. No pertinent past medical history. History reviewed. No pertinent surgical history.  FAMILY HISTORY History reviewed. No pertinent family history.  SOCIAL HISTORY Social History   Tobacco Use  . Smoking status: Former Research scientist (life sciences)  . Smokeless tobacco: Never Used         OPHTHALMIC EXAM:  Base Eye Exam    Visual Acuity (ETDRS)      Right Left   Dist cc 20/30 20/30 +2   Dist ph cc NI NI   Correction: Glasses       Tonometry (Tonopen, 8:51 AM)       Right Left   Pressure 13 13       Pupils      Pupils Dark Light Shape React APD   Right PERRL 4 3 Round Brisk None   Left PERRL 4 3 Round Brisk None       Visual Fields (Counting fingers)      Left Right    Full Full       Extraocular Movement      Right Left    Full Full       Neuro/Psych    Oriented x3: Yes   Mood/Affect: Normal       Dilation    Both eyes: 1.0% Mydriacyl, 2.5% Phenylephrine @ 8:51 AM        Slit Lamp and Fundus Exam    External Exam      Right Left   External Normal Normal       Slit Lamp Exam      Right Left   Lids/Lashes Normal Normal   Conjunctiva/Sclera White and quiet White and quiet   Cornea Clear Clear   Anterior Chamber Deep and quiet Deep and quiet   Iris Round and reactive Round and reactive   Lens 1.5+ Nuclear sclerosis 1.5+ Nuclear sclerosis   Anterior Vitreous Normal Normal  Fundus Exam      Right Left   Posterior Vitreous Posterior vitreous detachment Posterior vitreous detachment   Disc Normal Normal   C/D Ratio 0.4 0.4   Macula Epiretinal membrane, no topographic distortion, minor thickening Epiretinal membrane, no topographic distortion, minor thickening   Vessels Normal Normal   Periphery Normal Normal          IMAGING AND PROCEDURES  Imaging and Procedures for 01/07/21  OCT, Retina - OU - Both Eyes       Right Eye Quality was good. Scan locations included subfoveal. Central Foveal Thickness: 448. Progression has no prior data. Findings include abnormal foveal contour, epiretinal membrane.   Left Eye Quality was good. Scan locations included subfoveal. Central Foveal Thickness: 299. Progression has no prior data. Findings include normal foveal contour.   Notes Moderate to severe epiretinal membrane right eye with thickening however no CME secondary nor macular retina schisis.  OS normal contour   incidental posterior vitreous detachment OU                ASSESSMENT/PLAN:  Posterior  vitreous detachment of both eyes Minor OU no retinal holes or tears, physiologic condition OU  Nuclear sclerotic cataract of both eyes Mild not visually significant cataract.  Right epiretinal membrane The nature of macular pucker (epiretinal membrane ERM) was discussed with the patient as well as threshold criteria for vitrectomy surgery. I explained that in rare cases another surgery is needed to actually remove a second wrinkle should it regrow.  Most often, the epiretinal membrane and underlying wrinkled internal limiting membrane are removed with the first surgery, to accomplish the goals.   If the operative eye is Phakic (natural lens still present), cataract surgery is often recommended prior to Vitrectomy. This will enable the retina surgeon to have the best view during surgery and the patient to obtain optimal results in the future. Treatment options were discussed.  Monocular vision testing discussed with the patient particularly to use reading materials in the monocular fashion to compare right eye versus left eye and to determine when or if visual acuity impact develops from this epiretinal membrane.  I did explain to the patient that should epiretinal membrane progress in the right eye or symptoms develop, it is most likely that cataract surgery in that eye will be required first to clear the visual axis and to determine the full impact of the epiretinal membrane on visual function moreover it would allow potential addressing the epiretinal membrane via surgical intervention should it be required based on his visual symptoms      ICD-10-CM   1. Right epiretinal membrane  H35.371 OCT, Retina - OU - Both Eyes  2. Posterior vitreous detachment of both eyes  H43.813   3. Nuclear sclerotic cataract of both eyes  H25.13     1.  Patient instructed in home monitoring on a monocular basis.  He should do this monocular testing at least once a week with reading material similar from week to  week judging and comparing the right eye versus the left eye versus both eyes.  He is aware that testing of both eyes versus the right eye alone is not appropriate as the brain will simply pay attention to the better sided eye.  Most importantly he should compare the left eye versus the right eye looking for changes over time  2.  3.  Ophthalmic Meds Ordered this visit:  No orders of the defined types were placed in this encounter.  Return in about 6 months (around 07/09/2021) for dilate, OD, OCT.  Patient Instructions  Macular Pucker  A macular pucker is scarring on the macula of the eye. The macula is the part of the eye that lets you see clearly and sharply. It also lets you see detail. The macula is in the middle of the thin membrane that covers the back of your eye (retina). A macular pucker usually starts in one eye but can affect both eyes over time. This condition can affect your vision. Vision changes can range from mild to severe. These vision changes usually do not get worse over time. What are the causes? This condition is caused by shrinking of the gel-like fluid that fills your eye (vitreous). The vitreous may shrink as you get older. As it shrinks, it pulls on your macula and forms scar tissue. What increases the risk? You are more likely to develop this condition if you are age 14 or older. Certain medical conditions can also increase your risk of macular pucker. These include:  A disease of the retina (retinopathy).  Injury or trauma to the eye.  Nearsightedness (myopia).  Tearing or detachment of the retina.  Inflammation of the eye (uveitis).  Diabetes. What are the signs or symptoms? Symptoms of this condition include:  Blurred vision. You may have trouble reading small print or seeing fine details.  Distorted vision. Straight lines may appear wavy.  A gray area or blind spot in the center of your vision. A mild pucker may cause no symptoms. How is  this diagnosed? This condition is diagnosed based on your medical history and a physical exam. You may be referred to a health care provider who specializes in eye conditions (ophthalmologist) for an eye exam. You may also have tests, including tests that involve:  Checking the back of your eye with a microscope.  Injecting dye into the back of your eye before doing an exam.  Taking pictures of the back of your eye. How is this treated? Treatment is not needed for this condition unless your symptoms interfere with your daily activities. Your eye care provider may change your prescription if you wear eyeglasses to help you see better. If treatment is needed, surgery is the only treatment for macular pucker.  This surgery (vitrectomy) is done only when macular pucker causes vision changes that interfere with your daily activities.  In this procedure, a surgeon removes any scar tissue and replaces your vitreous with a saltwater solution. Follow these instructions at home:  Be aware of any changes in your vision.  Keep all follow-up visits as told by your health care provider. This is important. Contact a health care provider if:  You have any new or worsening vision changes. Summary  A macular pucker is scarring on the macula of the eye.  This condition is caused by shrinking of the gel-like fluid that fills your eye.  A macular pucker can affect your vision. You may have trouble reading small print or seeing fine details.  Treatment is not needed unless the condition interferes with your daily activities. This information is not intended to replace advice given to you by your health care provider. Make sure you discuss any questions you have with your health care provider. Document Revised: 01/11/2018 Document Reviewed: 01/11/2018 Elsevier Patient Education  2021 [place] the diagnoses, plan, and follow up with the patient and they expressed understanding.   Patient expressed understanding of the importance of proper  follow up care.   Clent Demark Tajia Szeliga M.D. Diseases & Surgery of the Retina and Vitreous [facility] [place] 01/07/21     Abbreviations: M myopia (nearsighted); A astigmatism; H hyperopia (farsighted); P presbyopia; Mrx spectacle prescription;  CTL contact lenses; OD right eye; OS left eye; OU both eyes  XT exotropia; ET esotropia; PEK punctate epithelial keratitis; PEE punctate epithelial erosions; DES dry eye syndrome; MGD meibomian gland dysfunction; ATs artificial tears; PFAT's preservative free artificial tears; [place] nuclear sclerotic cataract; PSC posterior subcapsular cataract; ERM epi-retinal membrane; PVD posterior vitreous detachment; RD retinal detachment; DM diabetes mellitus; DR diabetic retinopathy; NPDR non-proliferative diabetic retinopathy; PDR proliferative diabetic retinopathy; CSME clinically significant macular edema; DME diabetic macular edema; dbh dot blot hemorrhages; CWS cotton wool spot; POAG primary open angle glaucoma; C/D cup-to-disc ratio; HVF humphrey visual field; GVF goldmann visual field; OCT optical coherence tomography; IOP intraocular pressure; BRVO Branch retinal vein occlusion; CRVO central retinal vein occlusion; CRAO central retinal artery occlusion; BRAO branch retinal artery occlusion; RT retinal tear; SB scleral buckle; PPV pars plana vitrectomy; VH Vitreous hemorrhage; PRP panretinal laser photocoagulation; IVK intravitreal kenalog; VMT vitreomacular traction; MH Macular hole;  NVD neovascularization of the disc; NVE neovascularization elsewhere; AREDS age related eye disease study; ARMD age related macular degeneration; POAG primary open angle glaucoma; EBMD epithelial/anterior basement membrane dystrophy; ACIOL anterior chamber intraocular lens; IOL intraocular lens; PCIOL posterior chamber intraocular lens; Phaco/IOL phacoemulsification with intraocular lens placement; [place] photorefractive  keratectomy; LASIK laser assisted in situ keratomileusis; HTN hypertension; DM diabetes mellitus; COPD chronic obstructive pulmonary disease

## 2021-04-12 ENCOUNTER — Inpatient Hospital Stay (HOSPITAL_COMMUNITY): Payer: Medicare Other

## 2021-04-12 ENCOUNTER — Other Ambulatory Visit: Payer: Self-pay

## 2021-04-12 ENCOUNTER — Inpatient Hospital Stay (HOSPITAL_COMMUNITY)
Admission: EM | Admit: 2021-04-12 | Discharge: 2021-04-15 | DRG: 390 | Disposition: A | Discharge status: Home / Self Care | Payer: Medicare Other | Attending: Internal Medicine | Admitting: Internal Medicine

## 2021-04-12 ENCOUNTER — Encounter (HOSPITAL_COMMUNITY): Payer: Self-pay | Admitting: Emergency Medicine

## 2021-04-12 ENCOUNTER — Emergency Department (HOSPITAL_COMMUNITY): Payer: Medicare Other

## 2021-04-12 DIAGNOSIS — K56609 Unspecified intestinal obstruction, unspecified as to partial versus complete obstruction: Secondary | ICD-10-CM | POA: Diagnosis present

## 2021-04-12 DIAGNOSIS — Z20822 Contact with and (suspected) exposure to covid-19: Secondary | ICD-10-CM | POA: Diagnosis present

## 2021-04-12 DIAGNOSIS — R109 Unspecified abdominal pain: Secondary | ICD-10-CM | POA: Diagnosis not present

## 2021-04-12 DIAGNOSIS — Z9221 Personal history of antineoplastic chemotherapy: Secondary | ICD-10-CM

## 2021-04-12 DIAGNOSIS — Z885 Allergy status to narcotic agent status: Secondary | ICD-10-CM

## 2021-04-12 DIAGNOSIS — R9431 Abnormal electrocardiogram [ECG] [EKG]: Secondary | ICD-10-CM | POA: Diagnosis not present

## 2021-04-12 DIAGNOSIS — K5651 Intestinal adhesions [bands], with partial obstruction: Secondary | ICD-10-CM | POA: Diagnosis not present

## 2021-04-12 DIAGNOSIS — Z4682 Encounter for fitting and adjustment of non-vascular catheter: Secondary | ICD-10-CM | POA: Diagnosis not present

## 2021-04-12 DIAGNOSIS — E860 Dehydration: Secondary | ICD-10-CM | POA: Diagnosis present

## 2021-04-12 DIAGNOSIS — R1084 Generalized abdominal pain: Secondary | ICD-10-CM | POA: Diagnosis not present

## 2021-04-12 DIAGNOSIS — Z85118 Personal history of other malignant neoplasm of bronchus and lung: Secondary | ICD-10-CM | POA: Diagnosis not present

## 2021-04-12 DIAGNOSIS — Z933 Colostomy status: Secondary | ICD-10-CM | POA: Diagnosis not present

## 2021-04-12 DIAGNOSIS — R030 Elevated blood-pressure reading, without diagnosis of hypertension: Secondary | ICD-10-CM

## 2021-04-12 DIAGNOSIS — R112 Nausea with vomiting, unspecified: Secondary | ICD-10-CM

## 2021-04-12 DIAGNOSIS — R1030 Lower abdominal pain, unspecified: Secondary | ICD-10-CM | POA: Diagnosis not present

## 2021-04-12 DIAGNOSIS — Z85048 Personal history of other malignant neoplasm of rectum, rectosigmoid junction, and anus: Secondary | ICD-10-CM

## 2021-04-12 DIAGNOSIS — Z23 Encounter for immunization: Secondary | ICD-10-CM

## 2021-04-12 DIAGNOSIS — K5669 Other partial intestinal obstruction: Secondary | ICD-10-CM | POA: Diagnosis not present

## 2021-04-12 DIAGNOSIS — Z923 Personal history of irradiation: Secondary | ICD-10-CM | POA: Diagnosis not present

## 2021-04-12 DIAGNOSIS — Z87891 Personal history of nicotine dependence: Secondary | ICD-10-CM

## 2021-04-12 DIAGNOSIS — Z0189 Encounter for other specified special examinations: Secondary | ICD-10-CM

## 2021-04-12 DIAGNOSIS — Z902 Acquired absence of lung [part of]: Secondary | ICD-10-CM | POA: Diagnosis not present

## 2021-04-12 DIAGNOSIS — Z9049 Acquired absence of other specified parts of digestive tract: Secondary | ICD-10-CM | POA: Diagnosis not present

## 2021-04-12 HISTORY — DX: Malignant (primary) neoplasm, unspecified: C80.1

## 2021-04-12 LAB — URINALYSIS, MICROSCOPIC (REFLEX): Squamous Epithelial / HPF: NONE SEEN (ref 0–5)

## 2021-04-12 LAB — URINALYSIS, ROUTINE W REFLEX MICROSCOPIC
Bilirubin Urine: NEGATIVE
Glucose, UA: NEGATIVE mg/dL
Hgb urine dipstick: NEGATIVE
Ketones, ur: 40 mg/dL — AB
Leukocytes,Ua: NEGATIVE
Nitrite: NEGATIVE
Protein, ur: 30 mg/dL — AB
Specific Gravity, Urine: 1.01 (ref 1.005–1.030)
pH: 8.5 — ABNORMAL HIGH (ref 5.0–8.0)

## 2021-04-12 LAB — RESP PANEL BY RT-PCR (FLU A&B, COVID) ARPGX2
Influenza A by PCR: NEGATIVE
Influenza B by PCR: NEGATIVE
SARS Coronavirus 2 by RT PCR: NEGATIVE

## 2021-04-12 LAB — CBC
HCT: 37.3 % — ABNORMAL LOW (ref 39.0–52.0)
Hemoglobin: 12.5 g/dL — ABNORMAL LOW (ref 13.0–17.0)
MCH: 29.6 pg (ref 26.0–34.0)
MCHC: 33.5 g/dL (ref 30.0–36.0)
MCV: 88.2 fL (ref 80.0–100.0)
Platelets: 398 10*3/uL (ref 150–400)
RBC: 4.23 MIL/uL (ref 4.22–5.81)
RDW: 14 % (ref 11.5–15.5)
WBC: 7.9 10*3/uL (ref 4.0–10.5)
nRBC: 0 % (ref 0.0–0.2)

## 2021-04-12 LAB — COMPREHENSIVE METABOLIC PANEL
ALT: 14 U/L (ref 0–44)
AST: 25 U/L (ref 15–41)
Albumin: 4.1 g/dL (ref 3.5–5.0)
Alkaline Phosphatase: 42 U/L (ref 38–126)
Anion gap: 13 (ref 5–15)
BUN: 17 mg/dL (ref 8–23)
CO2: 23 mmol/L (ref 22–32)
Calcium: 9.8 mg/dL (ref 8.9–10.3)
Chloride: 101 mmol/L (ref 98–111)
Creatinine, Ser: 1.2 mg/dL (ref 0.61–1.24)
GFR, Estimated: >60 mL/min (ref >60)
Glucose, Bld: 131 mg/dL — ABNORMAL HIGH (ref 70–99)
Potassium: 4.1 mmol/L (ref 3.5–5.1)
Sodium: 137 mmol/L (ref 135–145)
Total Bilirubin: 1.1 mg/dL (ref 0.3–1.2)
Total Protein: 7.5 g/dL (ref 6.5–8.1)

## 2021-04-12 LAB — LIPASE, BLOOD: Lipase: 31 U/L (ref 11–51)

## 2021-04-12 MED ORDER — ONDANSETRON HCL 4 MG/2ML IJ SOLN
4.0000 mg | Freq: Once | INTRAMUSCULAR | Status: AC
  Administered 2021-04-12: 4 mg via INTRAVENOUS
  Filled 2021-04-12: qty 2

## 2021-04-12 MED ORDER — ENOXAPARIN SODIUM 40 MG/0.4ML IJ SOSY
40.0000 mg | PREFILLED_SYRINGE | INTRAMUSCULAR | Status: DC
  Administered 2021-04-12 – 2021-04-14 (×3): 40 mg via SUBCUTANEOUS
  Filled 2021-04-12 (×3): qty 0.4

## 2021-04-12 MED ORDER — DIATRIZOATE MEGLUMINE & SODIUM 66-10 % PO SOLN
90.0000 mL | Freq: Once | ORAL | Status: AC
  Administered 2021-04-12: 90 mL via NASOGASTRIC
  Filled 2021-04-12 (×2): qty 90

## 2021-04-12 MED ORDER — SODIUM CHLORIDE 0.9 % IV BOLUS
1000.0000 mL | Freq: Once | INTRAVENOUS | Status: AC
  Administered 2021-04-12: 1000 mL via INTRAVENOUS

## 2021-04-12 MED ORDER — HYDROMORPHONE HCL 1 MG/ML IJ SOLN
0.5000 mg | INTRAMUSCULAR | Status: DC | PRN
Start: 2021-04-12 — End: 2021-04-15
  Administered 2021-04-12 – 2021-04-13 (×4): 0.5 mg via INTRAVENOUS
  Filled 2021-04-12 (×4): qty 0.5

## 2021-04-12 MED ORDER — ONDANSETRON HCL 4 MG/2ML IJ SOLN
4.0000 mg | Freq: Four times a day (QID) | INTRAMUSCULAR | Status: DC | PRN
  Administered 2021-04-12 – 2021-04-13 (×3): 4 mg via INTRAVENOUS
  Filled 2021-04-12 (×3): qty 2

## 2021-04-12 MED ORDER — INFLUENZA VAC A&B SA ADJ QUAD 0.5 ML IM PRSY
0.5000 mL | PREFILLED_SYRINGE | INTRAMUSCULAR | Status: AC
  Administered 2021-04-13: 0.5 mL via INTRAMUSCULAR
  Filled 2021-04-12: qty 0.5

## 2021-04-12 MED ORDER — FENTANYL CITRATE PF 50 MCG/ML IJ SOSY: 25.0000 ug | PREFILLED_SYRINGE | INTRAMUSCULAR | Status: DC | PRN

## 2021-04-12 MED ORDER — DEXTROSE-NACL 5-0.9 % IV SOLN: INTRAVENOUS | Status: DC

## 2021-04-12 MED ORDER — HYDROMORPHONE HCL 1 MG/ML IJ SOLN
0.5000 mg | Freq: Once | INTRAMUSCULAR | Status: AC
  Administered 2021-04-12: 0.5 mg via INTRAVENOUS
  Filled 2021-04-12: qty 1

## 2021-04-12 MED ORDER — IOHEXOL 350 MG/ML SOLN
80.0000 mL | Freq: Once | INTRAVENOUS | Status: AC | PRN
  Administered 2021-04-12: 80 mL via INTRAVENOUS

## 2021-04-12 MED ORDER — SODIUM CHLORIDE 0.9 % IV SOLN: INTRAVENOUS | Status: DC

## 2021-04-12 NOTE — Consult Note (Signed)
CC: Abdominal pain with nausea and vomiting  Requesting provider: Dr. Deno Etienne  HPI: This is a 75 year old gentleman with a previous history of rectal cancer who is status post an APR in 2003.  He has had multiple chronic bowel obstructions.  He underwent a diagnostic laparoscopy with lysis of adhesions and colostomy revision at [facility] in 2020.  He still had intermittent chronic abdominal complaints since then.  Yesterday, he developed abdominal pain with nausea vomiting.  He had decreased ostomy output.  He does present to the emergency department.  His daughter reports he had a similar episode last weekend.  Currently his nausea has improved with medication and he has minimal abdominal pain.  He denies fever or chills.  He has no chest pain or shortness of breath.  Past Medical History:  Diagnosis Date   Cancer [facility])    Colon 2003 and Lung 2011    History reviewed. No pertinent surgical history.  No family history on file.  Social:  reports that he has quit smoking. He has never used smokeless tobacco. No history on file for alcohol use and drug use.  Allergies:  Allergies  Allergen Reactions   Morphine And Related     Medications: I have reviewed the patient's current medications.  Results for orders placed or performed during the hospital encounter of 04/12/21 (from the past 48 hour(s))  Lipase, blood     Status: None   Collection Time: 04/12/21  6:58 AM  Result Value Ref Range   Lipase 31 11 - 51 U/L    Comment: Performed at [facility], [place] [address] [place], [place] [address]  Comprehensive metabolic panel     Status: Abnormal   Collection Time: 04/12/21  6:58 AM  Result Value Ref Range   Sodium 137 135 - 145 mmol/L   Potassium 4.1 3.5 - 5.1 mmol/L   Chloride 101 98 - 111 mmol/L   CO2 23 22 - 32 mmol/L   Glucose, Bld 131 (H) 70 - 99 mg/dL    Comment: Glucose reference range applies only to samples taken after fasting  for at least 8 hours.   BUN 17 8 - 23 mg/dL   Creatinine, Ser 1.20 0.61 - 1.24 mg/dL   Calcium 9.8 8.9 - 10.3 mg/dL   Total Protein 7.5 6.5 - 8.1 g/dL   Albumin 4.1 3.5 - 5.0 g/dL   AST 25 15 - 41 U/L   ALT 14 0 - 44 U/L   Alkaline Phosphatase 42 38 - 126 U/L   Total Bilirubin 1.1 0.3 - 1.2 mg/dL   GFR, Estimated >60 >60 mL/min    Comment: (NOTE) Calculated using the CKD-EPI Creatinine Equation (2021)    Anion gap 13 5 - 15    Comment: Performed at [facility], [place] [address], [place], [place] [address]  CBC     Status: Abnormal   Collection Time: 04/12/21  6:58 AM  Result Value Ref Range   WBC 7.9 4.0 - 10.5 K/uL   RBC 4.23 4.22 - 5.81 MIL/uL   Hemoglobin 12.5 (L) 13.0 - 17.0 g/dL   HCT 37.3 (L) 39.0 - 52.0 %   MCV 88.2 80.0 - 100.0 fL   MCH 29.6 26.0 - 34.0 pg   MCHC 33.5 30.0 - 36.0 g/dL   RDW 14.0 11.5 - 15.5 %   Platelets 398 150 - 400 K/uL   nRBC 0.0 0.0 - 0.2 %    Comment:  Performed at [facility], [place] [address], [place], [place] [address]  Urinalysis, Routine w reflex microscopic Urine, Clean Catch     Status: Abnormal   Collection Time: 04/12/21  7:56 AM  Result Value Ref Range   Color, Urine YELLOW YELLOW   APPearance CLEAR CLEAR   Specific Gravity, Urine 1.010 1.005 - 1.030   pH 8.5 (H) 5.0 - 8.0   Glucose, UA NEGATIVE NEGATIVE mg/dL   Hgb urine dipstick NEGATIVE NEGATIVE   Bilirubin Urine NEGATIVE NEGATIVE   Ketones, ur 40 (A) NEGATIVE mg/dL   Protein, ur 30 (A) NEGATIVE mg/dL   Nitrite NEGATIVE NEGATIVE   Leukocytes,Ua NEGATIVE NEGATIVE    Comment: Performed at [place] [address], [place], [place] [address]  Urinalysis, Microscopic (reflex)     Status: Abnormal   Collection Time: 04/12/21  7:56 AM  Result Value Ref Range   RBC / HPF 0-5 0 - 5 RBC/hpf   WBC, UA 0-5 0 - 5 WBC/hpf   Bacteria, UA RARE (A) NONE SEEN   Squamous Epithelial / LPF NONE SEEN 0 - 5    Comment: Performed at  [facility], [place] [address] [place], [place] [address]    CT ABDOMEN PELVIS W CONTRAST  Result Date: 04/12/2021 CLINICAL DATA:  Abdominal pain and nausea since yesterday. History of bowel obstruction. Colon cancer 19 years ago. EXAM: CT ABDOMEN AND PELVIS WITH CONTRAST TECHNIQUE: Multidetector CT imaging of the abdomen and pelvis was performed using the standard protocol following bolus administration of intravenous contrast. CONTRAST:  28mL OMNIPAQUE IOHEXOL 350 MG/ML SOLN COMPARISON:  Abdominopelvic CT report of 08/18/2001. FINDINGS: Lower chest: Right lower lobe wedge resection with surrounding scarring. Normal heart size without pericardial or pleural effusion. Hepatobiliary: Focal steatosis adjacent the falciform ligament. No suspicious liver lesion. Normal gallbladder, without biliary ductal dilatation. Pancreas: Normal, without mass or ductal dilatation. Spleen: Normal in size, without focal abnormality. Adrenals/Urinary Tract: Normal adrenal glands. 1.3 cm lower pole right renal cyst. Normal left kidney. No hydronephrosis. Normal urinary bladder. Stomach/Bowel: Normal stomach, without wall thickening. Status post descending colostomy and abdominal perineal resection for rectal cancer. The remaining colon is partially decompressed. Normal terminal ileum. Mid small bowel loops are mildly dilated at up to 3.2 cm, fluid-filled. Undergo a gradual transition to normal caliber distal small bowel. A segment of collapsed bowel within the dilated portion including on 56/2 demonstrates apparent wall thickening. There is edema and mesenteric fluid involving the dilated small bowel, without specific evidence of complicating ischemia. Vascular/Lymphatic: Aortic atherosclerosis. No abdominopelvic adenopathy. Reproductive: Normal prostate. Other: No free intraperitoneal air. Small volume pelvic fluid including on 64/2. Presacral soft tissue thickening is likely treatment related. No evidence of  omental or peritoneal disease. Musculoskeletal: Osteopenia. Degenerate disc disease at the lumbosacral junction. IMPRESSION: 1. Status post abdominal perineal resection for rectal cancer. 2. Mid small bowel dilatation, mild, favoring partial small bowel obstruction, most likely secondary to adhesions. 3. Short segment area of underdistention and apparent wall thickening within the dilated small bowel could represent concurrent enteritis. Adjacent surrounding mesenteric edema is nonspecific. No specific evidence of complicating ischemia. 4. Trace pelvic fluid, likely secondary. 5. No findings of metastatic disease. Electronically Signed   By: Abigail Miyamoto M.D.   On: 04/12/2021 09:09    ROS - all of the below systems have been reviewed with the patient and positives are indicated with bold text General: chills, fever or night sweats Eyes: blurry vision or double vision  ENT: epistaxis or sore throat Allergy/Immunology: itchy/watery eyes or nasal congestion Hematologic/Lymphatic: bleeding problems, blood clots or swollen lymph nodes Endocrine: temperature intolerance or unexpected weight changes Breast: new or changing breast lumps or nipple discharge Resp: cough, shortness of breath, or wheezing CV: chest pain or dyspnea on exertion GI: as per HPI GU: dysuria, trouble voiding, or hematuria MSK: joint pain or joint stiffness Neuro: TIA or stroke symptoms Derm: pruritus and skin lesion changes Psych: anxiety and depression  PE Blood pressure 121/86, pulse 77, temperature 98 F (36.7 C), temperature source Oral, resp. rate 18, SpO2 96 %. Constitutional: NAD; conversant; no deformities Eyes: Moist conjunctiva; no lid lag; anicteric; PERRL Neck: Trachea midline; no thyromegaly Lungs: Normal respiratory effort; no tactile fremitus CV: RRR; no palpable thrills; no pitting edema GI: Abd soft and nondistended.  The ostomy is pink and well perfused with some liquid in the bag.  There are no obvious  hernias; no palpable hepatosplenomegaly MSK: Normal range of motion of extremities; no clubbing/cyanosis Psychiatric: Appropriate affect; alert and oriented x3   Results for orders placed or performed during the hospital encounter of 04/12/21 (from the past 48 hour(s))  Lipase, blood     Status: None   Collection Time: 04/12/21  6:58 AM  Result Value Ref Range   Lipase 31 11 - 51 U/L    Comment: Performed at [facility], [place] [address], [place], [place] [address]  Comprehensive metabolic panel     Status: Abnormal   Collection Time: 04/12/21  6:58 AM  Result Value Ref Range   Sodium 137 135 - 145 mmol/L   Potassium 4.1 3.5 - 5.1 mmol/L   Chloride 101 98 - 111 mmol/L   CO2 23 22 - 32 mmol/L   Glucose, Bld 131 (H) 70 - 99 mg/dL    Comment: Glucose reference range applies only to samples taken after fasting for at least 8 hours.   BUN 17 8 - 23 mg/dL   Creatinine, Ser 1.20 0.61 - 1.24 mg/dL   Calcium 9.8 8.9 - 10.3 mg/dL   Total Protein 7.5 6.5 - 8.1 g/dL   Albumin 4.1 3.5 - 5.0 g/dL   AST 25 15 - 41 U/L   ALT 14 0 - 44 U/L   Alkaline Phosphatase 42 38 - 126 U/L   Total Bilirubin 1.1 0.3 - 1.2 mg/dL   GFR, Estimated >60 >60 mL/min    Comment: (NOTE) Calculated using the CKD-EPI Creatinine Equation (2021)    Anion gap 13 5 - 15    Comment: Performed at [facility], [place] [address] [place], [place] [address]  CBC     Status: Abnormal   Collection Time: 04/12/21  6:58 AM  Result Value Ref Range   WBC 7.9 4.0 - 10.5 K/uL   RBC 4.23 4.22 - 5.81 MIL/uL   Hemoglobin 12.5 (L) 13.0 - 17.0 g/dL   HCT 37.3 (L) 39.0 - 52.0 %   MCV 88.2 80.0 - 100.0 fL   MCH 29.6 26.0 - 34.0 pg   MCHC 33.5 30.0 - 36.0 g/dL   RDW 14.0 11.5 - 15.5 %   Platelets 398 150 - 400 K/uL   nRBC 0.0 0.0 - 0.2 %    Comment: Performed at [facility], [place] [address], [place], [place] [address]  Urinalysis, Routine w reflex microscopic Urine, Clean Catch      Status: Abnormal   Collection Time: 04/12/21  7:56 AM  Result Value Ref Range  Color, Urine YELLOW YELLOW   APPearance CLEAR CLEAR   Specific Gravity, Urine 1.010 1.005 - 1.030   pH 8.5 (H) 5.0 - 8.0   Glucose, UA NEGATIVE NEGATIVE mg/dL   Hgb urine dipstick NEGATIVE NEGATIVE   Bilirubin Urine NEGATIVE NEGATIVE   Ketones, ur 40 (A) NEGATIVE mg/dL   Protein, ur 30 (A) NEGATIVE mg/dL   Nitrite NEGATIVE NEGATIVE   Leukocytes,Ua NEGATIVE NEGATIVE    Comment: Performed at [place] [address], [place], [place] [address]  Urinalysis, Microscopic (reflex)     Status: Abnormal   Collection Time: 04/12/21  7:56 AM  Result Value Ref Range   RBC / HPF 0-5 0 - 5 RBC/hpf   WBC, UA 0-5 0 - 5 WBC/hpf   Bacteria, UA RARE (A) NONE SEEN   Squamous Epithelial / LPF NONE SEEN 0 - 5    Comment: Performed at [facility], [place] [address] [place], [place] [address]    CT ABDOMEN PELVIS W CONTRAST  Result Date: 04/12/2021 CLINICAL DATA:  Abdominal pain and nausea since yesterday. History of bowel obstruction. Colon cancer 19 years ago. EXAM: CT ABDOMEN AND PELVIS WITH CONTRAST TECHNIQUE: Multidetector CT imaging of the abdomen and pelvis was performed using the standard protocol following bolus administration of intravenous contrast. CONTRAST:  92mL OMNIPAQUE IOHEXOL 350 MG/ML SOLN COMPARISON:  Abdominopelvic CT report of 08/18/2001. FINDINGS: Lower chest: Right lower lobe wedge resection with surrounding scarring. Normal heart size without pericardial or pleural effusion. Hepatobiliary: Focal steatosis adjacent the falciform ligament. No suspicious liver lesion. Normal gallbladder, without biliary ductal dilatation. Pancreas: Normal, without mass or ductal dilatation. Spleen: Normal in size, without focal abnormality. Adrenals/Urinary Tract: Normal adrenal glands. 1.3 cm lower pole right renal cyst. Normal left kidney. No hydronephrosis. Normal urinary bladder.  Stomach/Bowel: Normal stomach, without wall thickening. Status post descending colostomy and abdominal perineal resection for rectal cancer. The remaining colon is partially decompressed. Normal terminal ileum. Mid small bowel loops are mildly dilated at up to 3.2 cm, fluid-filled. Undergo a gradual transition to normal caliber distal small bowel. A segment of collapsed bowel within the dilated portion including on 56/2 demonstrates apparent wall thickening. There is edema and mesenteric fluid involving the dilated small bowel, without specific evidence of complicating ischemia. Vascular/Lymphatic: Aortic atherosclerosis. No abdominopelvic adenopathy. Reproductive: Normal prostate. Other: No free intraperitoneal air. Small volume pelvic fluid including on 64/2. Presacral soft tissue thickening is likely treatment related. No evidence of omental or peritoneal disease. Musculoskeletal: Osteopenia. Degenerate disc disease at the lumbosacral junction. IMPRESSION: 1. Status post abdominal perineal resection for rectal cancer. 2. Mid small bowel dilatation, mild, favoring partial small bowel obstruction, most likely secondary to adhesions. 3. Short segment area of underdistention and apparent wall thickening within the dilated small bowel could represent concurrent enteritis. Adjacent surrounding mesenteric edema is nonspecific. No specific evidence of complicating ischemia. 4. Trace pelvic fluid, likely secondary. 5. No findings of metastatic disease. Electronically Signed   By: Abigail Miyamoto M.D.   On: 04/12/2021 09:09     A/P: Partial small bowel obstruction  I have reviewed his CT scan of the abdomen pelvis.  I have also reviewed his notes in care everywhere including the operative report from his last surgery in 2020.  Again, he is mild obstruction is likely secondary to adhesions.  I discussed this with him and his daughter in detail.  Given the edema and wall thickening in the 1 loop of small bowel I would  recommend nasogastric suctioning, bowel rest, and continued IV rehydration.  Once he has a nasogastric tube in place we will also order the small bowel protocol.  We will follow him with you.  Coralie Keens, MD [facility] Use [email or web address] to contact on call provider

## 2021-04-12 NOTE — H&P (Signed)
History and Physical    Jon Wallace ZDG:[id number] DOB: January 29, 1946 DOA: 04/12/2021  PCP: Briscoe Deutscher, MD Patient coming from: Home  Chief Complaint: Abdominal pain and vomiting  HPI: CONG HIGHTOWER is a 75 y.o. male with history of colorectal cancer s/p resection, chemoradiation and colostomy in 2003 and right lung cancer s/p resection in 2011 and recurrent SBO presenting with cramping abdominal pain and vomiting.  Patient reports cramping abdominal pain and emesis since 4 PM yesterday.  Rates his pain 10/10 at the severity.  Pain improved after IV Dilaudid in ED.  Currently 6/10.  Pain is mainly across lower abdomen.  Pain is constant although it eases off at times.  Reports emesis almost every 15 to 20 minutes.  Emesis was nonbloody.  Also noted small output from ostomy yesterday.  No output from ostomy today.  Not sure about fever but reports feeling hot.  Denies URI symptoms, chest pain or dyspnea.  He denies UTI symptoms.    He reports recurrent small bowel obstruction.  He says he had bowel obstruction last night with similar symptoms.  At that time, he did not come to the hospital.  His symptoms resolved after 2 days.   Patient denies smoking cigarette, drinking alcohol recreational drug use.  Prefers to remain full code.  In ED, BP slightly elevated.  Other vitals within normal.  CBC, CMP and UA without significant finding.  CT abdomen and pelvis raises concern for SBO and possible enteritis.  General surgery consulted.  NG tube inserted.  Hospitalist service called for admission.  ROS All review of system negative except for pertinent positives and negatives as history of present illness above.  PMH Past Medical History:  Diagnosis Date   Cancer [facility])    Colon 2003 and Lung 2011   [place] History reviewed. No pertinent surgical history. Fam HX No family history on file.  Social Hx  reports that he has quit smoking. He has never used smokeless tobacco. No history on file  for alcohol use and drug use.  Allergy Allergies  Allergen Reactions   Morphine And Related    Home Meds Prior to Admission medications   Medication Sig Start Date End Date Taking? Authorizing Provider  cyclobenzaprine (FLEXERIL) 10 MG tablet Take 1 tablet (10 mg total) by mouth at bedtime. 05/14/12   Leandrew Koyanagi, MD  HYDROcodone-acetaminophen [facility]) 5-325 MG per tablet 1-2 at bedtime if needed for pain 05/14/12   Leandrew Koyanagi, MD    Physical Exam: Vitals:   04/12/21 0645 04/12/21 0730 04/12/21 0915 04/12/21 1045  BP: (!) 161/92 (!) 153/86 121/86 (!) 154/92  Pulse: 88 71 77 70  Resp: 18 16 18 11   Temp: 98 F (36.7 C)     TempSrc: Oral     SpO2: 99% 99% 96% 96%    GENERAL: No acute distress.  Appears well.  HEENT: MMM.  Vision and hearing grossly intact.  NECK: Supple.  No apparent JVD.  RESP:  No IWOB. Good air movement bilaterally. CVS:  RRR. Heart sounds normal.  ABD/GI/GU: Bowel sounds present. Soft.  Diffuse tenderness but no rebound or guarding. Greenish fluid (order removal) in ostomy bag. MSK/EXT:  Moves extremities. No apparent deformity or edema.  SKIN: no apparent skin lesion or wound NEURO: Awake, alert and oriented appropriately.  No gross deficit.  PSYCH: Calm. Normal affect.   Personally Reviewed Radiological Exams CT ABDOMEN PELVIS W CONTRAST  Result Date: 04/12/2021 CLINICAL DATA:  Abdominal pain and nausea since  yesterday. History of bowel obstruction. Colon cancer 19 years ago. EXAM: CT ABDOMEN AND PELVIS WITH CONTRAST TECHNIQUE: Multidetector CT imaging of the abdomen and pelvis was performed using the standard protocol following bolus administration of intravenous contrast. CONTRAST:  29mL OMNIPAQUE IOHEXOL 350 MG/ML SOLN COMPARISON:  Abdominopelvic CT report of 08/18/2001. FINDINGS: Lower chest: Right lower lobe wedge resection with surrounding scarring. Normal heart size without pericardial or pleural effusion. Hepatobiliary: Focal  steatosis adjacent the falciform ligament. No suspicious liver lesion. Normal gallbladder, without biliary ductal dilatation. Pancreas: Normal, without mass or ductal dilatation. Spleen: Normal in size, without focal abnormality. Adrenals/Urinary Tract: Normal adrenal glands. 1.3 cm lower pole right renal cyst. Normal left kidney. No hydronephrosis. Normal urinary bladder. Stomach/Bowel: Normal stomach, without wall thickening. Status post descending colostomy and abdominal perineal resection for rectal cancer. The remaining colon is partially decompressed. Normal terminal ileum. Mid small bowel loops are mildly dilated at up to 3.2 cm, fluid-filled. Undergo a gradual transition to normal caliber distal small bowel. A segment of collapsed bowel within the dilated portion including on 56/2 demonstrates apparent wall thickening. There is edema and mesenteric fluid involving the dilated small bowel, without specific evidence of complicating ischemia. Vascular/Lymphatic: Aortic atherosclerosis. No abdominopelvic adenopathy. Reproductive: Normal prostate. Other: No free intraperitoneal air. Small volume pelvic fluid including on 64/2. Presacral soft tissue thickening is likely treatment related. No evidence of omental or peritoneal disease. Musculoskeletal: Osteopenia. Degenerate disc disease at the lumbosacral junction. IMPRESSION: 1. Status post abdominal perineal resection for rectal cancer. 2. Mid small bowel dilatation, mild, favoring partial small bowel obstruction, most likely secondary to adhesions. 3. Short segment area of underdistention and apparent wall thickening within the dilated small bowel could represent concurrent enteritis. Adjacent surrounding mesenteric edema is nonspecific. No specific evidence of complicating ischemia. 4. Trace pelvic fluid, likely secondary. 5. No findings of metastatic disease. Electronically Signed   By: Abigail Miyamoto M.D.   On: 04/12/2021 09:09     Personally Reviewed  Labs: CBC: Recent Labs  Lab 04/12/21 0658  WBC 7.9  HGB 12.5*  HCT 37.3*  MCV 88.2  PLT 094   Basic Metabolic Panel: Recent Labs  Lab 04/12/21 0658  NA 137  K 4.1  CL 101  CO2 23  GLUCOSE 131*  BUN 17  CREATININE 1.20  CALCIUM 9.8   GFR: CrCl cannot be calculated (Unknown ideal weight.). Liver Function Tests: Recent Labs  Lab 04/12/21 0658  AST 25  ALT 14  ALKPHOS 42  BILITOT 1.1  PROT 7.5  ALBUMIN 4.1   Recent Labs  Lab 04/12/21 0658  LIPASE 31   No results for input(s): AMMONIA in the last 168 hours. Coagulation Profile: No results for input(s): INR, PROTIME in the last 168 hours. Cardiac Enzymes: No results for input(s): CKTOTAL, CKMB, CKMBINDEX, TROPONINI in the last 168 hours. BNP (last 3 results) No results for input(s): PROBNP in the last 8760 hours. HbA1C: No results for input(s): HGBA1C in the last 72 hours. CBG: No results for input(s): GLUCAP in the last 168 hours. Lipid Profile: No results for input(s): CHOL, HDL, LDLCALC, TRIG, CHOLHDL, LDLDIRECT in the last 72 hours. Thyroid Function Tests: No results for input(s): TSH, T4TOTAL, FREET4, T3FREE, THYROIDAB in the last 72 hours. Anemia Panel: No results for input(s): VITAMINB12, FOLATE, FERRITIN, TIBC, IRON, RETICCTPCT in the last 72 hours. Urine analysis:    Component Value Date/Time   COLORURINE YELLOW 04/12/2021 [place] 04/12/2021 0756   LABSPEC 1.010 04/12/2021 0756  PHURINE 8.5 (H) 04/12/2021 0756   GLUCOSEU NEGATIVE 04/12/2021 0756   HGBUR NEGATIVE 04/12/2021 0756   BILIRUBINUR NEGATIVE 04/12/2021 0756   KETONESUR 40 (A) 04/12/2021 0756   PROTEINUR 30 (A) 04/12/2021 0756   NITRITE NEGATIVE 04/12/2021 0756   LEUKOCYTESUR NEGATIVE 04/12/2021 0756    Sepsis Labs:  None  Personally Reviewed EKG:  None  Assessment/Plan Recurrent small bowel obstruction in patient with colostomy-presented with cramping abdominal pain and intractable nausea and vomiting CT  abdomen pelvis raises concern for SBO and possible enteritis.  Seems to be recurrent issue. -General surgery managing with NG tube decompression and SBO protocol -IV fluid, antiemetics and analgesics  Intractable nausea, vomiting and abdominal pain: Likely due to the above. -Management as above  History of colorectal cancer s/p resection, chemoradiation and colostomy in 2003 at [facility]  History of lung cancer status post right lower lobe resection in 2011-in remission  Elevated blood pressure-likely due to #1.  Improved.  DVT prophylaxis: Subcu Lovenox  Code Status: Full code Family Communication: Updated patient's daughter at bedside  Disposition Plan: Admit to MedSurg Consults called: General surgery Admission status: Inpatient Level of care: Med-Surg   Mercy Riding MD [facility]  If 7PM-7AM, please contact night-coverage [email or web address]  04/12/2021, 11:08 AM

## 2021-04-12 NOTE — ED Provider Notes (Signed)
[facility] DEPT Provider Note   CSN: [id number] Arrival date & time: 04/12/21  0631     History Chief Complaint  Patient presents with   Abdominal Pain   Nausea    Jon Wallace is a 75 y.o. male.  75 yo M with a cc of abdominal pain nausea and vomiting.  Feels like the last time he had a small bowel obstruction which he said was about a year ago.  At that time he had to have surgical revision.  The patient states that he does get episodes like this since the revision but usually last for few hours and the symptoms been lasting greater than 12.  No fevers or chills.  The history is provided by the patient.  Abdominal Pain Pain location:  Generalized Pain radiates to:  Does not radiate Pain severity:  Moderate Onset quality:  Gradual Duration:  2 days Timing:  Constant Progression:  Worsening Chronicity:  New Relieved by:  Nothing Worsened by:  Nothing Ineffective treatments:  None tried Associated symptoms: no chest pain, no chills, no diarrhea, no fever, no shortness of breath and no vomiting       Past Medical History:  Diagnosis Date   Cancer ([place])    Colon 2003 and Lung 2011    Patient Active Problem List   Diagnosis Date Noted   SBO (small bowel obstruction) ([place]) 04/12/2021   Right epiretinal membrane 01/07/2021   Posterior vitreous detachment of both eyes 01/07/2021   Nuclear sclerotic cataract of both eyes 01/07/2021   Colon cancer-2003? 05/14/2012   Lung cancer ([place]) 05/14/2012    History reviewed. No pertinent surgical history.     No family history on file.  Social History   Tobacco Use   Smoking status: Former   Smokeless tobacco: Never    Home Medications Prior to Admission medications   Medication Sig Start Date End Date Taking? Authorizing Provider  Simethicone (GAS-X PO) Take 1 capsule by mouth daily as needed (flatulence).   Yes [provider]  cyclobenzaprine (FLEXERIL) 10 MG tablet Take 1  tablet (10 mg total) by mouth at bedtime. Patient not taking: No sig reported 05/14/12   Leandrew Koyanagi, MD  HYDROcodone-acetaminophen [facility]) 5-325 MG per tablet 1-2 at bedtime if needed for pain Patient not taking: No sig reported 05/14/12   Leandrew Koyanagi, MD    Allergies    Morphine and related  Review of Systems   Review of Systems  Constitutional:  Negative for chills and fever.  HENT:  Negative for congestion and facial swelling.   Eyes:  Negative for discharge and visual disturbance.  Respiratory:  Negative for shortness of breath.   Cardiovascular:  Negative for chest pain and palpitations.  Gastrointestinal:  Positive for abdominal pain. Negative for diarrhea and vomiting.  Musculoskeletal:  Negative for arthralgias and myalgias.  Skin:  Negative for color change and rash.  Neurological:  Negative for tremors, syncope and headaches.  Psychiatric/Behavioral:  Negative for confusion and dysphoric mood.    Physical Exam Updated Vital Signs BP (!) 155/86 (BP Location: Left Arm)   Pulse 72   Temp 98.2 F (36.8 C) (Oral)   Resp 16   Ht 5\' 9"  (1.753 m)   Wt 63.5 kg   SpO2 97%   BMI 20.67 kg/m   Physical Exam Vitals and nursing note reviewed.  Constitutional:      Appearance: He is well-developed.  HENT:     Head: Normocephalic and atraumatic.  Eyes:     Pupils: Pupils are equal, round, and reactive to light.  Neck:     Vascular: No JVD.  Cardiovascular:     Rate and Rhythm: Normal rate and regular rhythm.     Heart sounds: No murmur heard.   No friction rub. No gallop.  Pulmonary:     Effort: No respiratory distress.     Breath sounds: No wheezing.  Abdominal:     General: There is no distension.     Tenderness: There is abdominal tenderness. There is no guarding or rebound.     Comments: Mild diffuse abdominal discomfort.  Ostomy with bluish fluid  Musculoskeletal:        General: Normal range of motion.     Cervical back: Normal range of motion  and neck supple.  Skin:    Coloration: Skin is not pale.     Findings: No rash.  Neurological:     Mental Status: He is alert and oriented to person, place, and time.  Psychiatric:        Behavior: Behavior normal.    ED Results / Procedures / Treatments   Labs (all labs ordered are listed, but only abnormal results are displayed) Labs Reviewed  COMPREHENSIVE METABOLIC PANEL - Abnormal; Notable for the following components:      Result Value   Glucose, Bld 131 (*)    All other components within normal limits  CBC - Abnormal; Notable for the following components:   Hemoglobin 12.5 (*)    HCT 37.3 (*)    All other components within normal limits  URINALYSIS, ROUTINE W REFLEX MICROSCOPIC - Abnormal; Notable for the following components:   pH 8.5 (*)    Ketones, ur 40 (*)    Protein, ur 30 (*)    All other components within normal limits  URINALYSIS, MICROSCOPIC (REFLEX) - Abnormal; Notable for the following components:   Bacteria, UA RARE (*)    All other components within normal limits  RESP PANEL BY RT-PCR (FLU A&B, COVID) ARPGX2  LIPASE, BLOOD    EKG EKG Interpretation  Date/Time:  Saturday April 12 2021 07:26:45 EDT Ventricular Rate:  68 PR Interval:  153 QRS Duration: 91 QT Interval:  392 QTC Calculation: 417 R Axis:   78 Text Interpretation: Sinus rhythm Atrial premature complex No significant change since last tracing Confirmed by Deno Etienne [phone]) on 04/12/2021 7:47:21 AM  Radiology CT ABDOMEN PELVIS W CONTRAST  Result Date: 04/12/2021 CLINICAL DATA:  Abdominal pain and nausea since yesterday. History of bowel obstruction. Colon cancer 19 years ago. EXAM: CT ABDOMEN AND PELVIS WITH CONTRAST TECHNIQUE: Multidetector CT imaging of the abdomen and pelvis was performed using the standard protocol following bolus administration of intravenous contrast. CONTRAST:  3mL OMNIPAQUE IOHEXOL 350 MG/ML SOLN COMPARISON:  Abdominopelvic CT report of 08/18/2001. FINDINGS:  Lower chest: Right lower lobe wedge resection with surrounding scarring. Normal heart size without pericardial or pleural effusion. Hepatobiliary: Focal steatosis adjacent the falciform ligament. No suspicious liver lesion. Normal gallbladder, without biliary ductal dilatation. Pancreas: Normal, without mass or ductal dilatation. Spleen: Normal in size, without focal abnormality. Adrenals/Urinary Tract: Normal adrenal glands. 1.3 cm lower pole right renal cyst. Normal left kidney. No hydronephrosis. Normal urinary bladder. Stomach/Bowel: Normal stomach, without wall thickening. Status post descending colostomy and abdominal perineal resection for rectal cancer. The remaining colon is partially decompressed. Normal terminal ileum. Mid small bowel loops are mildly dilated at up to 3.2 cm, fluid-filled. Undergo a gradual transition to normal  caliber distal small bowel. A segment of collapsed bowel within the dilated portion including on 56/2 demonstrates apparent wall thickening. There is edema and mesenteric fluid involving the dilated small bowel, without specific evidence of complicating ischemia. Vascular/Lymphatic: Aortic atherosclerosis. No abdominopelvic adenopathy. Reproductive: Normal prostate. Other: No free intraperitoneal air. Small volume pelvic fluid including on 64/2. Presacral soft tissue thickening is likely treatment related. No evidence of omental or peritoneal disease. Musculoskeletal: Osteopenia. Degenerate disc disease at the lumbosacral junction. IMPRESSION: 1. Status post abdominal perineal resection for rectal cancer. 2. Mid small bowel dilatation, mild, favoring partial small bowel obstruction, most likely secondary to adhesions. 3. Short segment area of underdistention and apparent wall thickening within the dilated small bowel could represent concurrent enteritis. Adjacent surrounding mesenteric edema is nonspecific. No specific evidence of complicating ischemia. 4. Trace pelvic fluid,  likely secondary. 5. No findings of metastatic disease. Electronically Signed   By: Abigail Miyamoto M.D.   On: 04/12/2021 09:09   DG Abd Portable 1V-Small Bowel Protocol-Position Verification  Result Date: 04/12/2021 CLINICAL DATA:  NG tube placement. EXAM: PORTABLE ABDOMEN - 1 VIEW COMPARISON:  None. FINDINGS: Limited view of the abdomen demonstrates enteric catheter overlying the left upper quadrant of the abdomen, with tip far laterally. Nonspecific bowel gas pattern. IMPRESSION: Enteric catheter overlies the left upper quadrant of the abdomen, with tip far laterally, possibly within the far lateral aspect of the gastric body. Electronically Signed   By: Fidela Salisbury M.D.   On: 04/12/2021 12:49    Procedures Procedures   Medications Ordered in ED Medications  diatrizoate meglumine-sodium (GASTROGRAFIN) 66-10 % solution 90 mL (has no administration in time range)  dextrose 5 %-0.9 % sodium chloride infusion ( Intravenous New Bag/Given 04/12/21 1353)  HYDROmorphone (DILAUDID) injection 0.5 mg (0.5 mg Intravenous Given 04/12/21 1348)  ondansetron (ZOFRAN) injection 4 mg (4 mg Intravenous Given 04/12/21 1347)  ondansetron (ZOFRAN) injection 4 mg (4 mg Intravenous Given 04/12/21 0735)  HYDROmorphone (DILAUDID) injection 0.5 mg (0.5 mg Intravenous Given 04/12/21 0737)  sodium chloride 0.9 % bolus 1,000 mL (1,000 mLs Intravenous New Bag/Given 04/12/21 0735)  iohexol (OMNIPAQUE) 350 MG/ML injection 80 mL (80 mLs Intravenous Contrast Given 04/12/21 0816)    ED Course  I have reviewed the triage vital signs and the nursing notes.  Pertinent labs & imaging results that were available during my care of the patient were reviewed by me and considered in my medical decision making (see chart for details).    MDM Rules/Calculators/A&P                           75 yo M with a cc of abdominal pain.  Going on for the past day.  Hx of obstruction and feels the same.  Labs, ct.   CT scan is concerning  for a partial small bowel obstruction.  LFTs and lipase are unremarkable.  No significant anemia.  Mild ketones in the urine consistent with dehydration.  Given a bolus of IV fluids pain and nausea medicine with some improvement.  I discussed the case with Dr. Ninfa Linden, general surgery recommended medical admission and they will come evaluate the patient at bedside.  The patients results and plan were reviewed and discussed.   Any x-rays performed were independently reviewed by myself.   Differential diagnosis were considered with the presenting HPI.  Medications  diatrizoate meglumine-sodium (GASTROGRAFIN) 66-10 % solution 90 mL (has no administration in time range)  dextrose 5 %-  0.9 % sodium chloride infusion ( Intravenous New Bag/Given 04/12/21 1353)  HYDROmorphone (DILAUDID) injection 0.5 mg (0.5 mg Intravenous Given 04/12/21 1348)  ondansetron (ZOFRAN) injection 4 mg (4 mg Intravenous Given 04/12/21 1347)  ondansetron (ZOFRAN) injection 4 mg (4 mg Intravenous Given 04/12/21 0735)  HYDROmorphone (DILAUDID) injection 0.5 mg (0.5 mg Intravenous Given 04/12/21 0737)  sodium chloride 0.9 % bolus 1,000 mL (1,000 mLs Intravenous New Bag/Given 04/12/21 0735)  iohexol (OMNIPAQUE) 350 MG/ML injection 80 mL (80 mLs Intravenous Contrast Given 04/12/21 0816)    Vitals:   04/12/21 1255 04/12/21 1258 04/12/21 1321 04/12/21 1406  BP: (!) 149/81  (!) 155/86   Pulse: 74  72   Resp: 14  16   Temp:  98.1 F (36.7 C) 98.2 F (36.8 C)   TempSrc:  Oral Oral   SpO2: 98%  97%   Weight:    63.5 kg  Height:    5\' 9"  (1.753 m)    Final diagnoses:  Encounter for imaging study to confirm nasogastric (NG) tube placement  Small bowel obstruction ([place])    Admission/ observation were discussed with the admitting physician, patient and/or family and they are comfortable with the plan.   Final Clinical Impression(s) / ED Diagnoses Final diagnoses:  Encounter for imaging study to confirm nasogastric (NG) tube  placement  Small bowel obstruction [facility])    Rx / DC Orders ED Discharge Orders     None        Deno Etienne, DO 04/12/21 1518

## 2021-04-12 NOTE — ED Triage Notes (Signed)
Patient here from home reporting abd pain, nausea since yesterday. Reports "nothing in colostomy bag in awhile". Hx of bowel obstruction.

## 2021-04-13 DIAGNOSIS — K56609 Unspecified intestinal obstruction, unspecified as to partial versus complete obstruction: Secondary | ICD-10-CM | POA: Diagnosis not present

## 2021-04-13 NOTE — Progress Notes (Signed)
PROGRESS NOTE  Jon Wallace  DOB: 09-06-1945  PCP: Briscoe Deutscher, MD KGM:[id number]  DOA: 04/12/2021  LOS: 1 day  Hospital Day: 2   Chief Complaint  Patient presents with   Abdominal Pain   Nausea    Brief narrative: MONTAVIOUS Wallace is a 75 y.o. male with PMH significant for colorectal cancer s/p resection, chemoradiation and colostomy in 2003 and right lung cancer s/p resection in 2011 and recurrent SBO. Patient presented to the ED on 9/10 with complaint of cramping abdominal pain, nausea, vomiting for several hours associated with only minimal output from ostomy.   In the ED, blood pressure was elevated to 161/92. Labs mostly unremarkable CT abdomen pelvis raised concern of small bowel obstruction and enteritis. General surgery consultation was called.  NG tube was inserted. Admitted to hospitalist service.  Subjective: Patient was seen and examined this morning.  Pleasant elderly Caucasian male.  Propped up in bed.  Not in distress.  Feels better after NG tube was taken out.  General surgery seems of started the patient on clear liquid diet. Daughter at bedside. Chart reviewed Hemodynamically stable No labs this morning  Assessment/Plan: Recurrence of small bowel obstruction  -Presented with cramping abdominal pain, vomiting, minimal output from ostomy.  -CT abdomen suggestive of enteritis and small bowel obstruction -Surgery consult appreciated.  Was initially started on NG tube to suction.  Subsequent abdominal x-ray showed improvement in bowel gas pattern.  Started on clear liquid diet this morning.  Continue to monitor.  Elevated blood pressure -Blood pressure was elevated to 161/92 on admission, probably due to acute pain..  No history of hypertension. -Not on scheduled medication.  Blood pressure spontaneously improving  History of colorectal cancer  -s/p resection, chemoradiation and colostomy in 2003 at [facility]   History of lung cancer  -s/p right  lower lobe resection in 2011-in remission    Mobility: Encourage ambulation Code Status:   Code Status: Full Code  Nutritional status: Body mass index is 20.67 kg/m.     Diet:  Diet Order             Diet clear liquid Room service appropriate? Yes; Fluid consistency: Thin  Diet effective now                  DVT prophylaxis:  enoxaparin (LOVENOX) injection 40 mg Start: 04/12/21 2000   Antimicrobials: None Fluid: Currently on D5 NS@125  mill per hour.  I will reduce it to 50 mill per hour. Consultants: General surgery Family Communication: Daughter at bedside  Status is: Inpatient  Remains inpatient appropriate because: Diet gradually being advanced  Dispo: The patient is from: Home              Anticipated d/c is to: Home likely tomorrow              Patient currently is not medically stable to d/c.   Difficult to place patient No     Infusions:   dextrose 5 % and 0.9% NaCl 125 mL/hr at 04/12/21 2200    Scheduled Meds:  enoxaparin (LOVENOX) injection  40 mg Subcutaneous Q24H    Antimicrobials: Anti-infectives (From admission, onward)    None       PRN meds: HYDROmorphone (DILAUDID) injection, ondansetron (ZOFRAN) IV   Objective: Vitals:   04/13/21 0530 04/13/21 0957  BP: 132/83 126/80  Pulse: 88 85  Resp: 16 16  Temp: 99.6 F (37.6 C) 98.1 F (36.7 C)  SpO2: 93% 96%  Intake/Output Summary (Last 24 hours) at 04/13/2021 1258 Last data filed at 04/13/2021 1200 Gross per 24 hour  Intake 3010.94 ml  Output 850 ml  Net 2160.94 ml   Filed Weights   04/12/21 1406  Weight: 63.5 kg   Weight change:  Body mass index is 20.67 kg/m.   Physical Exam: General exam: Pleasant, elderly Caucasian male.  Not in distress Skin: No rashes, lesions or ulcers. HEENT: Atraumatic, normocephalic, no obvious bleeding Lungs: Clear to auscultation bilaterally CVS: Regular rate and rhythm, no murmur GI/Abd soft, nontender, nondistended, bowel sounds  present, ostomy bag with some output and gas CNS: Alert, awake, oriented x3 Psychiatry: Mood appropriate Extremities: No pedal edema, no calf tenderness  Data Review: I have personally reviewed the laboratory data and studies available.  Recent Labs  Lab 04/12/21 0658  WBC 7.9  HGB 12.5*  HCT 37.3*  MCV 88.2  PLT 398   Recent Labs  Lab 04/12/21 0658  NA 137  K 4.1  CL 101  CO2 23  GLUCOSE 131*  BUN 17  CREATININE 1.20  CALCIUM 9.8    F/u labs ordered Unresulted Labs (From admission, onward)     Start     Ordered   04/19/21 0500  Creatinine, serum  (enoxaparin (LOVENOX)    CrCl >/= 30 ml/min)  Weekly,   R     Comments: while on enoxaparin therapy    04/12/21 1758            Signed, Terrilee Croak, MD [facility] 04/13/2021

## 2021-04-13 NOTE — Progress Notes (Signed)
Subjective/Chief Complaint: Feels better today with less abdominal pain    Objective: Vital signs in last 24 hours: Temp:  [98.1 F (36.7 C)-99.6 F (37.6 C)] 99.6 F (37.6 C) (09/11 0530) Pulse Rate:  [70-88] 88 (09/11 0530) Resp:  [11-16] 16 (09/11 0530) BP: (120-155)/(80-92) 132/83 (09/11 0530) SpO2:  [93 %-99 %] 93 % (09/11 0530) Weight:  [63.5 kg] 63.5 kg (09/10 1406)    Intake/Output from previous day: 09/10 0701 - 09/11 0700 In: 2267.5 [I.V.:2267.5] Out: 450 [Emesis/NG output:450] Intake/Output this shift: No intake/output data recorded.  Exam: Awake and alert Abdomen soft, non-tender, non-distended today.  Ostomy pink,  liquid in the bag  Lab Results:  Recent Labs    04/12/21 0658  WBC 7.9  HGB 12.5*  HCT 37.3*  PLT 398   BMET Recent Labs    04/12/21 0658  NA 137  K 4.1  CL 101  CO2 23  GLUCOSE 131*  BUN 17  CREATININE 1.20  CALCIUM 9.8   PT/INR No results for input(s): LABPROT, INR in the last 72 hours. ABG No results for input(s): PHART, HCO3 in the last 72 hours.  Invalid input(s): PCO2, PO2  Studies/Results: CT ABDOMEN PELVIS W CONTRAST  Result Date: 04/12/2021 CLINICAL DATA:  Abdominal pain and nausea since yesterday. History of bowel obstruction. Colon cancer 19 years ago. EXAM: CT ABDOMEN AND PELVIS WITH CONTRAST TECHNIQUE: Multidetector CT imaging of the abdomen and pelvis was performed using the standard protocol following bolus administration of intravenous contrast. CONTRAST:  17mL OMNIPAQUE IOHEXOL 350 MG/ML SOLN COMPARISON:  Abdominopelvic CT report of 08/18/2001. FINDINGS: Lower chest: Right lower lobe wedge resection with surrounding scarring. Normal heart size without pericardial or pleural effusion. Hepatobiliary: Focal steatosis adjacent the falciform ligament. No suspicious liver lesion. Normal gallbladder, without biliary ductal dilatation. Pancreas: Normal, without mass or ductal dilatation. Spleen: Normal in size,  without focal abnormality. Adrenals/Urinary Tract: Normal adrenal glands. 1.3 cm lower pole right renal cyst. Normal left kidney. No hydronephrosis. Normal urinary bladder. Stomach/Bowel: Normal stomach, without wall thickening. Status post descending colostomy and abdominal perineal resection for rectal cancer. The remaining colon is partially decompressed. Normal terminal ileum. Mid small bowel loops are mildly dilated at up to 3.2 cm, fluid-filled. Undergo a gradual transition to normal caliber distal small bowel. A segment of collapsed bowel within the dilated portion including on 56/2 demonstrates apparent wall thickening. There is edema and mesenteric fluid involving the dilated small bowel, without specific evidence of complicating ischemia. Vascular/Lymphatic: Aortic atherosclerosis. No abdominopelvic adenopathy. Reproductive: Normal prostate. Other: No free intraperitoneal air. Small volume pelvic fluid including on 64/2. Presacral soft tissue thickening is likely treatment related. No evidence of omental or peritoneal disease. Musculoskeletal: Osteopenia. Degenerate disc disease at the lumbosacral junction. IMPRESSION: 1. Status post abdominal perineal resection for rectal cancer. 2. Mid small bowel dilatation, mild, favoring partial small bowel obstruction, most likely secondary to adhesions. 3. Short segment area of underdistention and apparent wall thickening within the dilated small bowel could represent concurrent enteritis. Adjacent surrounding mesenteric edema is nonspecific. No specific evidence of complicating ischemia. 4. Trace pelvic fluid, likely secondary. 5. No findings of metastatic disease. Electronically Signed   By: Abigail Miyamoto M.D.   On: 04/12/2021 09:09   DG Abd Portable 1V-Small Bowel Obstruction Protocol-initial, 8 hr delay  Result Date: 04/13/2021 CLINICAL DATA:  SBO protocol EXAM: PORTABLE ABDOMEN - 1 VIEW COMPARISON:  04/12/2021 FINDINGS: Enteric tube terminates in the gastric  cardia. Prior dilated loops of small bowel  in the central abdomen are less conspicuous on the current study. Contrast in the ascending and transverse colon. Surgical clips overlying the pelvis. IMPRESSION: Contrast in the ascending and transverse colon. Prior dilated loops of small bowel in the central abdomen are less conspicuous on the current study. Electronically Signed   By: Julian Hy M.D.   On: 04/13/2021 00:11   DG Abd Portable 1V-Small Bowel Protocol-Position Verification  Result Date: 04/12/2021 CLINICAL DATA:  NG tube placement. EXAM: PORTABLE ABDOMEN - 1 VIEW COMPARISON:  None. FINDINGS: Limited view of the abdomen demonstrates enteric catheter overlying the left upper quadrant of the abdomen, with tip far laterally. Nonspecific bowel gas pattern. IMPRESSION: Enteric catheter overlies the left upper quadrant of the abdomen, with tip far laterally, possibly within the far lateral aspect of the gastric body. Electronically Signed   By: Fidela Salisbury M.D.   On: 04/12/2021 12:49    Anti-infectives: Anti-infectives (From admission, onward)    None       Assessment/Plan: SBO  Abdominal xray today with normal bowel gas pattern and contrast in the colon  Will d/c NG and start liquids   Jon Wallace 04/13/2021

## 2021-04-14 DIAGNOSIS — K56609 Unspecified intestinal obstruction, unspecified as to partial versus complete obstruction: Secondary | ICD-10-CM | POA: Diagnosis not present

## 2021-04-14 MED ORDER — POLYETHYLENE GLYCOL 3350 17 G PO PACK
17.0000 g | PACK | Freq: Every day | ORAL | Status: DC
  Administered 2021-04-14 – 2021-04-15 (×2): 17 g via ORAL
  Filled 2021-04-14 (×2): qty 1

## 2021-04-14 MED ORDER — DOCUSATE SODIUM 100 MG PO CAPS
100.0000 mg | ORAL_CAPSULE | Freq: Two times a day (BID) | ORAL | Status: DC
  Administered 2021-04-14 – 2021-04-15 (×3): 100 mg via ORAL
  Filled 2021-04-14 (×3): qty 1

## 2021-04-14 NOTE — Progress Notes (Signed)
Progress Note     Subjective: Patient resting comfortably and denies abdominal pain this AM. Passing flatus via stoma but no stool yet. Abdomen feels less bloated and patient reports tolerating CLD without n/v. Has not been ambulating much yet.   Objective: Vital signs in last 24 hours: Temp:  [98.1 F (36.7 C)-99.5 F (37.5 C)] 98.7 F (37.1 C) (09/12 0540) Pulse Rate:  [80-85] 82 (09/12 0540) Resp:  [15-18] 18 (09/12 0540) BP: (125-132)/(79-88) 127/81 (09/12 0540) SpO2:  [96 %-100 %] 100 % (09/12 0540)    Intake/Output from previous day: 09/11 0701 - 09/12 0700 In: 3395.4 [P.O.:1500; I.V.:1895.4] Out: 1050 [Urine:950; Emesis/NG output:100] Intake/Output this shift: No intake/output data recorded.  PE: General: pleasant, WD, WN male who is laying in bed in NAD Heart: regular, rate, and rhythm.   Lungs: CTAB, no wheezes, rhonchi, or rales noted.  Respiratory effort nonlabored Abd: soft, NT, ND, BS hypoactive, stoma viable with some gas in ostomy pouch MS: all 4 extremities are symmetrical with no cyanosis, clubbing, or edema. Skin: warm and dry with no masses, lesions, or rashes Neuro: Cranial nerves 2-12 grossly intact, sensation is normal throughout Psych: A&Ox3 with an appropriate affect.    Lab Results:  Recent Labs    04/12/21 0658  WBC 7.9  HGB 12.5*  HCT 37.3*  PLT 398   BMET Recent Labs    04/12/21 0658  NA 137  K 4.1  CL 101  CO2 23  GLUCOSE 131*  BUN 17  CREATININE 1.20  CALCIUM 9.8   PT/INR No results for input(s): LABPROT, INR in the last 72 hours. CMP     Component Value Date/Time   NA 137 04/12/2021 0658   K 4.1 04/12/2021 0658   CL 101 04/12/2021 0658   CO2 23 04/12/2021 0658   GLUCOSE 131 (H) 04/12/2021 0658   BUN 17 04/12/2021 0658   CREATININE 1.20 04/12/2021 0658   CREATININE 0.92 05/14/2012 1135   CALCIUM 9.8 04/12/2021 0658   PROT 7.5 04/12/2021 0658   ALBUMIN 4.1 04/12/2021 0658   AST 25 04/12/2021 0658   ALT 14  04/12/2021 0658   ALKPHOS 42 04/12/2021 0658   BILITOT 1.1 04/12/2021 0658   GFRNONAA >60 04/12/2021 0658   Lipase     Component Value Date/Time   LIPASE 31 04/12/2021 0658       Studies/Results: DG Abd Portable 1V-Small Bowel Obstruction Protocol-initial, 8 hr delay  Result Date: 04/13/2021 CLINICAL DATA:  SBO protocol EXAM: PORTABLE ABDOMEN - 1 VIEW COMPARISON:  04/12/2021 FINDINGS: Enteric tube terminates in the gastric cardia. Prior dilated loops of small bowel in the central abdomen are less conspicuous on the current study. Contrast in the ascending and transverse colon. Surgical clips overlying the pelvis. IMPRESSION: Contrast in the ascending and transverse colon. Prior dilated loops of small bowel in the central abdomen are less conspicuous on the current study. Electronically Signed   By: Julian Hy M.D.   On: 04/13/2021 00:11   DG Abd Portable 1V-Small Bowel Protocol-Position Verification  Result Date: 04/12/2021 CLINICAL DATA:  NG tube placement. EXAM: PORTABLE ABDOMEN - 1 VIEW COMPARISON:  None. FINDINGS: Limited view of the abdomen demonstrates enteric catheter overlying the left upper quadrant of the abdomen, with tip far laterally. Nonspecific bowel gas pattern. IMPRESSION: Enteric catheter overlies the left upper quadrant of the abdomen, with tip far laterally, possibly within the far lateral aspect of the gastric body. Electronically Signed   By: Linwood Dibbles.D.  On: 04/12/2021 12:49    Anti-infectives: Anti-infectives (From admission, onward)    None        Assessment/Plan SBO - hx of rectal cancer s/p APR in 2003, hx of diagnostic laparoscopy with LOA and colostomy revision 2020 - patient with contrast in colon on film yesterday, NGT was removed - pt tolerating CLD without abdominal pain, n/v, or abdominal distention - passing flatus - advance to FLD this AM and then as tolerated to soft diet - encourage mobilization this AM, bowel  regimen  FEN: FLD, IVF per [facility] VTE: SCDs, LMWH ID: no current abx  Hx of lung cancer s/p RLL resection in 2011  LOS: 2 days    Jon Wallace, [facility] Surgery 04/14/2021, 8:51 AM Please see Amion for pager number during day hours 7:00am-4:30pm

## 2021-04-14 NOTE — Progress Notes (Signed)
PROGRESS NOTE  ARAM DOMZALSKI  DOB: 1946/03/10  PCP: Briscoe Deutscher, MD LKG:[id number]  DOA: 04/12/2021  LOS: 2 days  Hospital Day: 3   Chief Complaint  Patient presents with   Abdominal Pain   Nausea    Brief narrative: Jon Wallace is a 75 y.o. male with PMH significant for colorectal cancer s/p resection, chemoradiation and colostomy in 2003 and right lung cancer s/p resection in 2011 and recurrent SBO. Patient presented to the ED on 9/10 with complaint of cramping abdominal pain, nausea, vomiting for several hours associated with only minimal output from ostomy.   In the ED, blood pressure was elevated to 161/92. Labs mostly unremarkable CT abdomen pelvis raised concern of small bowel obstruction and enteritis. General surgery consultation was called.  NG tube was inserted. Admitted to hospitalist service.  Subjective: Patient was seen and examined this morning.   Propped up in bed.  Not in distress.  Tolerating clear liquid diet.  Noted that general surgery advanced to full liquid diet this morning.    Assessment/Plan: Recurrence of small bowel obstruction  -Presented with cramping abdominal pain, vomiting, minimal output from ostomy.  -CT abdomen suggestive of enteritis and small bowel obstruction -Surgery consult appreciated.  Was initially started on NG tube to suction.  Subsequent abdominal x-ray showed improvement in bowel gas pattern.  Currently feeling good at this morning.  Elevated blood pressure -Blood pressure was elevated to 161/92 on admission, probably due to acute pain..  No history of hypertension. -Not on scheduled medication.  Blood pressure spontaneously improving.  History of colorectal cancer  -s/p resection, chemoradiation and colostomy in 2003 at [facility]   History of lung cancer  -s/p right lower lobe resection in 2011-in remission   Mobility: Encourage ambulation Code Status:   Code Status: Full Code  Nutritional status: Body  mass index is 20.67 kg/m.     Diet:  Diet Order             Diet full liquid Room service appropriate? Yes; Fluid consistency: Thin  Diet effective now                  DVT prophylaxis:  enoxaparin (LOVENOX) injection 40 mg Start: 04/12/21 2000   Antimicrobials: None Fluid: Normal saline 50 mill per hour Consultants: General surgery Family Communication: Daughter at bedside  Status is: Inpatient  Remains inpatient appropriate because: Diet gradually being advanced  Dispo: The patient is from: Home              Anticipated d/c is to: Home likely this afternoon versus tomorrow.  Defer to general surgery              Patient currently is not medically stable to d/c.   Difficult to place patient No     Infusions:   dextrose 5 % and 0.9% NaCl 50 mL/hr at 04/14/21 6644    Scheduled Meds:  docusate sodium  100 mg Oral BID   enoxaparin (LOVENOX) injection  40 mg Subcutaneous Q24H   polyethylene glycol  17 g Oral Daily    Antimicrobials: Anti-infectives (From admission, onward)    None       PRN meds: HYDROmorphone (DILAUDID) injection, ondansetron (ZOFRAN) IV   Objective: Vitals:   04/13/21 2104 04/14/21 0540  BP: 125/86 127/81  Pulse: 80 82  Resp: 18 18  Temp: 99 F (37.2 C) 98.7 F (37.1 C)  SpO2: 98% 100%    Intake/Output Summary (Last 24 hours)  at 04/14/2021 1151 Last data filed at 04/14/2021 1000 Gross per 24 hour  Intake 3712.92 ml  Output 650 ml  Net 3062.92 ml    Filed Weights   04/12/21 1406  Weight: 63.5 kg   Weight change:  Body mass index is 20.67 kg/m.   Physical Exam: General exam: Pleasant, elderly Caucasian male.  Not in distress Skin: No rashes, lesions or ulcers. HEENT: Atraumatic, normocephalic, no obvious bleeding Lungs: Clear to auscultation bilaterally CVS: Regular rate and rhythm, no murmur GI/Abd soft, nontender, nondistended, bowel sounds present, ostomy bag with some output and gas CNS: Alert, awake, oriented  x3 Psychiatry: Mood appropriate Extremities: No pedal edema, no calf tenderness  Data Review: I have personally reviewed the laboratory data and studies available.  Recent Labs  Lab 04/12/21 0658  WBC 7.9  HGB 12.5*  HCT 37.3*  MCV 88.2  PLT 398    Recent Labs  Lab 04/12/21 0658  NA 137  K 4.1  CL 101  CO2 23  GLUCOSE 131*  BUN 17  CREATININE 1.20  CALCIUM 9.8     F/u labs ordered Unresulted Labs (From admission, onward)     Start     Ordered   04/19/21 0500  Creatinine, serum  (enoxaparin (LOVENOX)    CrCl >/= 30 ml/min)  Weekly,   R     Comments: while on enoxaparin therapy    04/12/21 1758            Signed, Terrilee Croak, MD [facility] 04/14/2021

## 2021-04-14 NOTE — Plan of Care (Signed)
  Problem: Clinical Measurements: Goal: Ability to maintain clinical measurements within normal limits will improve Outcome: Progressing   

## 2021-04-15 DIAGNOSIS — K56609 Unspecified intestinal obstruction, unspecified as to partial versus complete obstruction: Secondary | ICD-10-CM | POA: Diagnosis not present

## 2021-04-15 NOTE — Progress Notes (Signed)
   Progress Note     Subjective: Patient reports passage of stool from stoma overnight. Denies abdominal pain or nausea and vomiting. Tolerating soft diet.   Objective: Vital signs in last 24 hours: Temp:  [98.4 F (36.9 C)-99.1 F (37.3 C)] 98.9 F (37.2 C) (09/13 0624) Pulse Rate:  [67-79] 70 (09/13 0624) Resp:  [16-18] 16 (09/13 0624) BP: (116-127)/(76-84) 118/83 (09/13 0624) SpO2:  [97 %-100 %] 99 % (09/13 0624)    Intake/Output from previous day: 09/12 0701 - 09/13 0700 In: 1771.4 [P.O.:580; I.V.:1191.4] Out: -  Intake/Output this shift: Total I/O In: 414.2 [P.O.:240; I.V.:174.2] Out: -   PE: General: pleasant, WD, WN male who is laying in bed in NAD Heart: regular, rate, and rhythm.   Lungs: CTAB, no wheezes, rhonchi, or rales noted.  Respiratory effort nonlabored Abd: soft, NT, ND, BS hypoactive, stoma viable with some gas/stool in ostomy pouch MS: all 4 extremities are symmetrical with no cyanosis, clubbing, or edema. Skin: warm and dry with no masses, lesions, or rashes Neuro: Cranial nerves 2-12 grossly intact, sensation is normal throughout Psych: A&Ox3 with an appropriate affect.    Lab Results:  No results for input(s): WBC, HGB, HCT, PLT in the last 72 hours. BMET No results for input(s): NA, K, CL, CO2, GLUCOSE, BUN, CREATININE, CALCIUM in the last 72 hours. PT/INR No results for input(s): LABPROT, INR in the last 72 hours. CMP     Component Value Date/Time   NA 137 04/12/2021 0658   K 4.1 04/12/2021 0658   CL 101 04/12/2021 0658   CO2 23 04/12/2021 0658   GLUCOSE 131 (H) 04/12/2021 0658   BUN 17 04/12/2021 0658   CREATININE 1.20 04/12/2021 0658   CREATININE 0.92 05/14/2012 1135   CALCIUM 9.8 04/12/2021 0658   PROT 7.5 04/12/2021 0658   ALBUMIN 4.1 04/12/2021 0658   AST 25 04/12/2021 0658   ALT 14 04/12/2021 0658   ALKPHOS 42 04/12/2021 0658   BILITOT 1.1 04/12/2021 0658   GFRNONAA >60 04/12/2021 0658   Lipase     Component Value  Date/Time   LIPASE 31 04/12/2021 0658       Studies/Results: No results found.  Anti-infectives: Anti-infectives (From admission, onward)    None        Assessment/Plan SBO - hx of rectal cancer s/p APR in 2003, hx of diagnostic laparoscopy with LOA and colostomy revision 2020 - patient with contrast in colon on film 9/11, NGT was removed - tolerating soft diet and having gas and stool from stoma - stable for discharge from a surgical perspective, recommend continuing colace and miralax as needed at home   FEN: soft diet, IVF per [facility] VTE: SCDs, LMWH ID: no current abx   Hx of lung cancer s/p RLL resection in 2011  LOS: 3 days    Norm Parcel, [facility] Surgery 04/15/2021, 9:36 AM Please see Amion for pager number during day hours 7:00am-4:30pm

## 2021-04-15 NOTE — Progress Notes (Signed)
Discharge instructions given to patient and all questions were answered.  

## 2021-04-15 NOTE — Discharge Summary (Signed)
Physician Discharge Summary  Jon [place] PPJ:[id number] DOB: [birth year] DOA: 04/12/2021  PCP: Vernie Shanks, MD  Admit date: 04/12/2021 Discharge date: 04/15/2021  Admitted From: Home Discharge disposition: Home   Code Status: Full Code   Discharge Diagnosis:   Active Problems:   SBO (small bowel obstruction) [facility])     Chief Complaint  Patient presents with   Abdominal Pain   Nausea    Brief narrative: Jon [place] is a 75 y.o. male with PMH significant for colorectal cancer s/p resection, chemoradiation and colostomy in 2003 and right lung cancer s/p resection in 2011 and recurrent SBO. Patient presented to the ED on 9/10 with complaint of cramping abdominal pain, nausea, vomiting for several hours associated with only minimal output from ostomy.   In the ED, blood pressure was elevated to 161/92. Labs mostly unremarkable CT abdomen pelvis raised concern of small bowel obstruction and enteritis. General surgery consultation was called.  NG tube was inserted. Admitted to hospitalist service. See below for details  Subjective: Patient was seen and examined this morning.   Propped up in bed.  Not in distress.  Tolerating full liquid diet.  Having stool and gas out from the stoma.  Hospital course Recurrence of small bowel obstruction  -Presented with cramping abdominal pain, vomiting, minimal output from ostomy.  -CT abdomen suggestive of enteritis and small bowel obstruction -Surgery consult appreciated.  Was initially started on NG tube to suction.  Subsequent abdominal x-ray showed improvement in bowel gas pattern.  Currently feeling good at this morning.  Elevated blood pressure -Blood pressure was intermittently elevated in the hospital because of acute pain.  No history of hypertension.  Not on scheduled medication.  Continue to monitor blood pressure at home.  History of colorectal cancer  -s/p resection, chemoradiation and colostomy in 2003 at  [place] as needed.   History of lung cancer  -s/p right lower lobe resection in 2011-in remission   Allergies as of 04/15/2021       Reactions   Morphine And Related Nausea And Vomiting        Medication List     STOP taking these medications    cyclobenzaprine 10 MG tablet Commonly known as: FLEXERIL   HYDROcodone-acetaminophen 5-325 MG tablet Commonly known as: Norco       TAKE these medications    GAS-X PO Take 1 capsule by mouth daily as needed (flatulence).        Discharge Instructions:  Diet Recommendation:  Discharge Diet Orders (From admission, onward)     Start     Ordered   04/14/21 0000  Diet general       Comments: Gradually advance to soft diet and to regular consistency in few days.   04/14/21 1154            Follow with Primary MD Vernie Shanks, MD in 7 days   Get CBC/BMP checked in next visit within 1 week by PCP or SNF MD ( we routinely change or add medications that can affect your baseline labs and fluid status, therefore we recommend that you get the mentioned basic workup next visit with your PCP, your PCP may decide not to get them or add new tests based on their clinical decision)  On your next visit with your PCP, please Get Medicines reviewed and adjusted.  Please request your PCP  to go over all Hospital Tests and Procedure/Radiological results at the follow up,  please get all Hospital records sent to your Prim MD by signing hospital release before you go home.  Activity: As tolerated with Full fall precautions use walker/cane & assistance as needed  For Heart failure patients - Check your Weight same time everyday, if you gain over 2 pounds, or you develop in leg swelling, experience more shortness of breath or chest pain, call your Primary MD immediately. Follow Cardiac Low Salt Diet and 1.5 lit/day fluid restriction.  If you have smoked or chewed Tobacco in the last 2 yrs please  stop smoking, stop any regular Alcohol  and or any Recreational drug use.  If you experience worsening of your admission symptoms, develop shortness of breath, life threatening emergency, suicidal or homicidal thoughts you must seek medical attention immediately by calling 911 or calling your MD immediately  if symptoms less severe.  You Must read complete instructions/literature along with all the possible adverse reactions/side effects for all the Medicines you take and that have been prescribed to you. Take any new Medicines after you have completely understood and accpet all the possible adverse reactions/side effects.   Do not drive, operate heavy machinery, perform activities at heights, swimming or participation in water activities or provide baby sitting services if your were admitted for syncope or siezures until you have seen by Primary MD or a Neurologist and advised to do so again.  Do not drive when taking Pain medications.  Do not take more than prescribed Pain, Sleep and Anxiety Medications  Wear Seat belts while driving.   Please note You were cared for by a hospitalist during your hospital stay. If you have any questions about your discharge medications or the care you received while you were in the hospital after you are discharged, you can call the unit and asked to speak with the hospitalist on call if the hospitalist that took care of you is not available. Once you are discharged, your primary care physician will handle any further medical issues. Please note that NO REFILLS for any discharge medications will be authorized once you are discharged, as it is imperative that you return to your primary care physician (or establish a relationship with a primary care physician if you do not have one) for your aftercare needs so that they can reassess your need for medications and monitor your lab values.    Follow ups:    Follow-up Information     Briscoe Deutscher, MD Follow up.    Specialty: Family Medicine Contact information: [place] [address] [phone]         Coralie Keens, MD Follow up.   Specialty: General Surgery Contact information: [address] [place] [address] [phone]                 Wound care:     Discharge Exam:   Vitals:   04/14/21 1332 04/14/21 2143 04/15/21 0439 04/15/21 0624  BP: 127/84 116/76 123/76 118/83  Pulse: 67 68 79 70  Resp: 17 16 18 16   Temp: 99 F (37.2 C) 99.1 F (37.3 C) 98.4 F (36.9 C) 98.9 F (37.2 C)  TempSrc: Oral Oral Oral Oral  SpO2: 100% 97% 100% 99%  Weight:      Height:        Body mass index is 20.67 kg/m.  General exam: Pleasant, elderly Caucasian male.  Not in distress Skin: No rashes, lesions or ulcers. HEENT: Atraumatic, normocephalic, no obvious bleeding Lungs: Clear to  auscultation bilaterally CVS: Regular rate and rhythm, no murmur GI/Abd soft, nontender, nondistended, bowel sound present.  Stoma with liquid stool and gas CNS: Alert, awake, oriented x3 Psychiatry: Mood appropriate Extremities: No pedal edema, no calf tenderness  Time coordinating discharge: 35 minutes   The results of significant diagnostics from this hospitalization (including imaging, microbiology, ancillary and laboratory) are listed below for reference.    Procedures and Diagnostic Studies:   CT ABDOMEN PELVIS W CONTRAST  Result Date: 04/12/2021 CLINICAL DATA:  Abdominal pain and nausea since yesterday. History of bowel obstruction. Colon cancer 19 years ago. EXAM: CT ABDOMEN AND PELVIS WITH CONTRAST TECHNIQUE: Multidetector CT imaging of the abdomen and pelvis was performed using the standard protocol following bolus administration of intravenous contrast. CONTRAST:  59mL OMNIPAQUE IOHEXOL 350 MG/ML SOLN COMPARISON:  Abdominopelvic CT report of 08/18/2001. FINDINGS: Lower chest: Right lower lobe wedge resection with surrounding scarring. Normal heart size  without pericardial or pleural effusion. Hepatobiliary: Focal steatosis adjacent the falciform ligament. No suspicious liver lesion. Normal gallbladder, without biliary ductal dilatation. Pancreas: Normal, without mass or ductal dilatation. Spleen: Normal in size, without focal abnormality. Adrenals/Urinary Tract: Normal adrenal glands. 1.3 cm lower pole right renal cyst. Normal left kidney. No hydronephrosis. Normal urinary bladder. Stomach/Bowel: Normal stomach, without wall thickening. Status post descending colostomy and abdominal perineal resection for rectal cancer. The remaining colon is partially decompressed. Normal terminal ileum. Mid small bowel loops are mildly dilated at up to 3.2 cm, fluid-filled. Undergo a gradual transition to normal caliber distal small bowel. A segment of collapsed bowel within the dilated portion including on 56/2 demonstrates apparent wall thickening. There is edema and mesenteric fluid involving the dilated small bowel, without specific evidence of complicating ischemia. Vascular/Lymphatic: Aortic atherosclerosis. No abdominopelvic adenopathy. Reproductive: Normal prostate. Other: No free intraperitoneal air. Small volume pelvic fluid including on 64/2. Presacral soft tissue thickening is likely treatment related. No evidence of omental or peritoneal disease. Musculoskeletal: Osteopenia. Degenerate disc disease at the lumbosacral junction. IMPRESSION: 1. Status post abdominal perineal resection for rectal cancer. 2. Mid small bowel dilatation, mild, favoring partial small bowel obstruction, most likely secondary to adhesions. 3. Short segment area of underdistention and apparent wall thickening within the dilated small bowel could represent concurrent enteritis. Adjacent surrounding mesenteric edema is nonspecific. No specific evidence of complicating ischemia. 4. Trace pelvic fluid, likely secondary. 5. No findings of metastatic disease. Electronically Signed   By: Abigail Miyamoto  M.D.   On: 04/12/2021 09:09   DG Abd Portable 1V-Small Bowel Obstruction Protocol-initial, 8 hr delay  Result Date: 04/13/2021 CLINICAL DATA:  SBO protocol EXAM: PORTABLE ABDOMEN - 1 VIEW COMPARISON:  04/12/2021 FINDINGS: Enteric tube terminates in the gastric cardia. Prior dilated loops of small bowel in the central abdomen are less conspicuous on the current study. Contrast in the ascending and transverse colon. Surgical clips overlying the pelvis. IMPRESSION: Contrast in the ascending and transverse colon. Prior dilated loops of small bowel in the central abdomen are less conspicuous on the current study. Electronically Signed   By: Julian Hy M.D.   On: 04/13/2021 00:11   DG Abd Portable 1V-Small Bowel Protocol-Position Verification  Result Date: 04/12/2021 CLINICAL DATA:  NG tube placement. EXAM: PORTABLE ABDOMEN - 1 VIEW COMPARISON:  None. FINDINGS: Limited view of the abdomen demonstrates enteric catheter overlying the left upper quadrant of the abdomen, with tip far laterally. Nonspecific bowel gas pattern. IMPRESSION: Enteric catheter overlies the left upper quadrant of the abdomen, with tip far laterally,  possibly within the far lateral aspect of the gastric body. Electronically Signed   By: Fidela Salisbury M.D.   On: 04/12/2021 12:49     Labs:   Basic Metabolic Panel: Recent Labs  Lab 04/12/21 0658  NA 137  K 4.1  CL 101  CO2 23  GLUCOSE 131*  BUN 17  CREATININE 1.20  CALCIUM 9.8   GFR Estimated Creatinine Clearance: 46.3 mL/min (by C-G formula based on SCr of 1.2 mg/dL). Liver Function Tests: Recent Labs  Lab 04/12/21 0658  AST 25  ALT 14  ALKPHOS 42  BILITOT 1.1  PROT 7.5  ALBUMIN 4.1   Recent Labs  Lab 04/12/21 0658  LIPASE 31   No results for input(s): AMMONIA in the last 168 hours. Coagulation profile No results for input(s): INR, PROTIME in the last 168 hours.  CBC: Recent Labs  Lab 04/12/21 0658  WBC 7.9  HGB 12.5*  HCT 37.3*  MCV  88.2  PLT 398   Cardiac Enzymes: No results for input(s): CKTOTAL, CKMB, CKMBINDEX, TROPONINI in the last 168 hours. BNP: Invalid input(s): POCBNP CBG: No results for input(s): GLUCAP in the last 168 hours. D-Dimer No results for input(s): DDIMER in the last 72 hours. Hgb A1c No results for input(s): HGBA1C in the last 72 hours. Lipid Profile No results for input(s): CHOL, HDL, LDLCALC, TRIG, CHOLHDL, LDLDIRECT in the last 72 hours. Thyroid function studies No results for input(s): TSH, T4TOTAL, T3FREE, THYROIDAB in the last 72 hours.  Invalid input(s): FREET3 Anemia work up No results for input(s): VITAMINB12, FOLATE, FERRITIN, TIBC, IRON, RETICCTPCT in the last 72 hours. Microbiology Recent Results (from the past 240 hour(s))  Resp Panel by RT-PCR (Flu A&B, Covid) Nasopharyngeal Swab     Status: None   Collection Time: 04/12/21 11:49 AM   Specimen: Nasopharyngeal Swab; Nasopharyngeal(NP) swabs in vial transport medium  Result Value Ref Range Status   SARS Coronavirus 2 by RT PCR NEGATIVE NEGATIVE Final    Comment: (NOTE) SARS-CoV-2 target nucleic acids are NOT DETECTED.  The SARS-CoV-2 RNA is generally detectable in upper respiratory specimens during the acute phase of infection. The lowest concentration of SARS-CoV-2 viral copies this assay can detect is 138 copies/mL. A negative result does not preclude SARS-Cov-2 infection and should not be used as the sole basis for treatment or other patient management decisions. A negative result may occur with  improper specimen collection/handling, submission of specimen other than nasopharyngeal swab, presence of viral mutation(s) within the areas targeted by this assay, and inadequate number of viral copies(<138 copies/mL). A negative result must be combined with clinical observations, patient history, and epidemiological information. The expected result is Negative.  Fact Sheet for Patients:   EntrepreneurPulse.com.au  Fact Sheet for Healthcare Providers:  IncredibleEmployment.be  This test is no t yet approved or cleared by the [place] FDA and  has been authorized for detection and/or diagnosis of SARS-CoV-2 by FDA under an Emergency Use Authorization (EUA). This EUA will remain  in effect (meaning this test can be used) for the duration of the COVID-19 declaration under Section 564(b)(1) of the Act, 21 U.S.C.section 360bbb-3(b)(1), unless the authorization is terminated  or revoked sooner.       Influenza A by PCR NEGATIVE NEGATIVE Final   Influenza B by PCR NEGATIVE NEGATIVE Final    Comment: (NOTE) The Xpert Xpress SARS-CoV-2/FLU/RSV plus assay is intended as an aid in the diagnosis of influenza from Nasopharyngeal swab specimens and should not be used as a  sole basis for treatment. Nasal washings and aspirates are unacceptable for Xpert Xpress SARS-CoV-2/FLU/RSV testing.  Fact Sheet for Patients: EntrepreneurPulse.com.au  Fact Sheet for Healthcare Providers: IncredibleEmployment.be  This test is not yet approved or cleared by the [place] FDA and has been authorized for detection and/or diagnosis of SARS-CoV-2 by FDA under an Emergency Use Authorization (EUA). This EUA will remain in effect (meaning this test can be used) for the duration of the COVID-19 declaration under Section 564(b)(1) of the Act, 21 U.S.C. section 360bbb-3(b)(1), unless the authorization is terminated or revoked.  Performed at [facility], [place] [address] [place], [place] [address]      Signed: Marlowe Aschoff Atiba Kimberlin  [facility] 04/15/2021, 9:43 AM

## 2021-04-24 ENCOUNTER — Telehealth: Payer: Self-pay | Admitting: Gastroenterology

## 2021-04-24 NOTE — Telephone Encounter (Signed)
Hey Dr Lyndel Safe, this pt is requesting to be seen by a GI provider

## 2021-05-14 NOTE — Telephone Encounter (Signed)
Hey Dr Lyndel Safe it looks like this pt is wishing to establish care with a new gi provider, it looks like he used to see Dr Eber Jones [facility] 11/2020. I will send the records to you for review. Please advise.

## 2021-05-27 NOTE — Telephone Encounter (Signed)
No problems. Can you please send records to [facility] office Then can make appointment with me or app clinic RG

## 2021-06-06 NOTE — Telephone Encounter (Signed)
Records received at [facility] office. Faxed to [employer] office.

## 2021-06-11 ENCOUNTER — Encounter: Payer: Self-pay | Admitting: Gastroenterology

## 2021-07-10 ENCOUNTER — Other Ambulatory Visit: Payer: Self-pay

## 2021-07-10 ENCOUNTER — Ambulatory Visit (INDEPENDENT_AMBULATORY_CARE_PROVIDER_SITE_OTHER): Payer: Medicare Other | Admitting: Ophthalmology

## 2021-07-10 ENCOUNTER — Encounter (INDEPENDENT_AMBULATORY_CARE_PROVIDER_SITE_OTHER): Payer: Self-pay | Admitting: Ophthalmology

## 2021-07-10 DIAGNOSIS — H2513 Age-related nuclear cataract, bilateral: Secondary | ICD-10-CM

## 2021-07-10 DIAGNOSIS — H35371 Puckering of macula, right eye: Secondary | ICD-10-CM | POA: Diagnosis not present

## 2021-07-10 NOTE — Assessment & Plan Note (Signed)
Moderate progression yet with epiretinal membrane in place progressing in the right eye likely to need vitrectomy membrane peel to improve acuity.  I discussed with the patient that cataract surgery lens placement is the safest way to proceed so as to reassess acuity postop cataract surgery and if the acuity improved substantially he may not need vitrectomy membrane peel.  Of also seeing cases where PVD can develop releasing of vitreal macular adhesion making an epiretinal membrane worse.  Fair therefore return to [facility] eye care Dr. Clent Jacks for evaluation possible cataract surgery and lens implant right eye

## 2021-07-10 NOTE — Progress Notes (Signed)
07/10/2021     CHIEF COMPLAINT Patient presents for  Chief Complaint  Patient presents with   Retina Follow Up      HISTORY OF PRESENT ILLNESS: Jon [place] is a 75 y.o. male who presents to the clinic today for:   HPI     Retina Follow Up           Laterality: right eye   Onset: 6 months ago   Severity: mild   Duration: 6 months   Course: stable         Comments   6 mos fu OD oct. Patient states vision is stable and unchanged since last visit. Denies any new floaters or FOL.       Last edited by Laurin Coder on 07/10/2021  8:55 AM.      Referring physician: Clent Jacks, MD [address] [place],  [place] [address]  HISTORICAL INFORMATION:   Selected notes from the MEDICAL RECORD NUMBER       CURRENT MEDICATIONS: No current outpatient medications on file. (Ophthalmic Drugs)   No current facility-administered medications for this visit. (Ophthalmic Drugs)   Current Outpatient Medications (Other)  Medication Sig   Simethicone (GAS-X PO) Take 1 capsule by mouth daily as needed (flatulence).   No current facility-administered medications for this visit. (Other)      REVIEW OF SYSTEMS:    ALLERGIES Allergies  Allergen Reactions   Morphine And Related Nausea And Vomiting    PAST MEDICAL HISTORY Past Medical History:  Diagnosis Date   Cancer [facility])    Colon 2003 and Lung 2011   History reviewed. No pertinent surgical history.  FAMILY HISTORY History reviewed. No pertinent family history.  SOCIAL HISTORY Social History   Tobacco Use   Smoking status: Former   Smokeless tobacco: Never         OPHTHALMIC EXAM:  Base Eye Exam     Visual Acuity (ETDRS)       Right Left   Dist cc 20/30 -1 20/25 -1    Correction: Glasses         Tonometry (Tonopen, 8:57 AM)       Right Left   Pressure 15 15         Pupils       Pupils Dark Light APD   Right PERRL 4 3 None   Left PERRL 4 3 None         Visual  Fields (Counting fingers)       Left Right    Full Full         Extraocular Movement       Right Left    Full Full         Neuro/Psych     Oriented x3: Yes   Mood/Affect: Normal         Dilation     Right eye: 1.0% Mydriacyl, 2.5% Phenylephrine @ 8:56 AM           Slit Lamp and Fundus Exam     External Exam       Right Left   External Normal Normal         Slit Lamp Exam       Right Left   Lids/Lashes Normal Normal   Conjunctiva/Sclera White and quiet White and quiet   Cornea Clear Clear   Anterior Chamber Deep and quiet Deep and quiet   Iris Round and reactive Round and reactive   Lens  1.5+ Nuclear sclerosis 1.5+ Nuclear sclerosis   Anterior Vitreous Normal Normal         Fundus Exam       Right Left   Posterior Vitreous Posterior vitreous detachment Posterior vitreous detachment   Disc Normal Normal   C/D Ratio 0.4 0.4   Macula Epiretinal membrane, no topographic distortion, minor thickening Normal   Vessels Normal Normal   Periphery Normal Normal            IMAGING AND PROCEDURES  Imaging and Procedures for 07/10/21  OCT, Retina - OU - Both Eyes       Right Eye Quality was good. Scan locations included subfoveal. Central Foveal Thickness: 455. Progression has no prior data. Findings include abnormal foveal contour, epiretinal membrane.   Left Eye Quality was good. Scan locations included subfoveal. Central Foveal Thickness: 299. Progression has been stable. Findings include normal foveal contour.   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  OS normal contour               ASSESSMENT/PLAN:  Nuclear sclerotic cataract of both eyes Moderate progression yet with epiretinal membrane in place progressing in the right eye likely to need vitrectomy membrane peel to improve acuity.  I  discussed with the patient that cataract surgery lens placement is the safest way to proceed so as to reassess acuity postop cataract surgery and if the acuity improved substantially he may not need vitrectomy membrane peel.  Of also seeing cases where PVD can develop releasing of vitreal macular adhesion making an epiretinal membrane worse.  Fair therefore return to [facility] eye care Dr. Clent Jacks for evaluation possible cataract surgery and lens implant right eye     ICD-10-CM   1. 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 Nuclear sclerotic cataract of both eyes  H25.13       1.  OD patient understands nonurgent epiretinal membrane that is progressing with impactful visual acuity that he now recognizes.  2.  I have had a long discussion I do suggest cataract surgery with lens implantation right eye in a sequential fashion so that if acuity improved substantially no further posterior segment surgery would be required however if acuity does not improve, the media opacity would be removed prior to vitrectomy membrane peel  3.  Ophthalmic Meds Ordered this visit:  No orders of the defined types were placed in this encounter.      Return in about 4 months (around 11/08/2021) for DILATE OU, OCT.  There are no Patient Instructions on file for this visit.   Explained the diagnoses, plan, and follow up with the patient and they expressed understanding.  Patient expressed understanding of the importance of proper follow up care.   Clent Demark Ole Lafon M.D. Diseases & Surgery of the Retina and Vitreous [facility] 07/10/21     Abbreviations: M myopia (nearsighted); A astigmatism; H hyperopia (farsighted); P presbyopia; Mrx spectacle prescription;  CTL contact lenses; OD right eye; OS left eye; OU both eyes  XT exotropia; ET esotropia; PEK punctate epithelial keratitis; PEE punctate epithelial erosions; DES dry eye syndrome; MGD meibomian gland  dysfunction; ATs artificial tears; PFAT's preservative free artificial tears; [place] nuclear sclerotic cataract; PSC posterior subcapsular cataract; ERM epi-retinal membrane; PVD posterior vitreous detachment; RD retinal detachment; DM diabetes mellitus; DR diabetic retinopathy;  NPDR non-proliferative diabetic retinopathy; PDR proliferative diabetic retinopathy; CSME clinically significant macular edema; DME diabetic macular edema; dbh dot blot hemorrhages; CWS cotton wool spot; POAG primary open angle glaucoma; C/D cup-to-disc ratio; HVF humphrey visual field; GVF goldmann visual field; OCT optical coherence tomography; IOP intraocular pressure; BRVO Branch retinal vein occlusion; CRVO central retinal vein occlusion; CRAO central retinal artery occlusion; BRAO branch retinal artery occlusion; RT retinal tear; SB scleral buckle; PPV pars plana vitrectomy; VH Vitreous hemorrhage; PRP panretinal laser photocoagulation; IVK intravitreal kenalog; VMT vitreomacular traction; MH Macular hole;  NVD neovascularization of the disc; NVE neovascularization elsewhere; AREDS age related eye disease study; ARMD age related macular degeneration; POAG primary open angle glaucoma; EBMD epithelial/anterior basement membrane dystrophy; ACIOL anterior chamber intraocular lens; IOL intraocular lens; PCIOL posterior chamber intraocular lens; Phaco/IOL phacoemulsification with intraocular lens placement; [place] photorefractive keratectomy; LASIK laser assisted in situ keratomileusis; HTN hypertension; DM diabetes mellitus; COPD chronic obstructive pulmonary disease

## 2021-07-11 ENCOUNTER — Ambulatory Visit (INDEPENDENT_AMBULATORY_CARE_PROVIDER_SITE_OTHER): Payer: Medicare Other | Admitting: Gastroenterology

## 2021-07-11 ENCOUNTER — Other Ambulatory Visit (INDEPENDENT_AMBULATORY_CARE_PROVIDER_SITE_OTHER): Payer: Medicare Other

## 2021-07-11 ENCOUNTER — Encounter: Payer: Self-pay | Admitting: Gastroenterology

## 2021-07-11 VITALS — BP 122/80 | HR 84 | Ht 69.0 in | Wt 139.5 lb

## 2021-07-11 DIAGNOSIS — K56600 Partial intestinal obstruction, unspecified as to cause: Secondary | ICD-10-CM

## 2021-07-11 DIAGNOSIS — Z85048 Personal history of other malignant neoplasm of rectum, rectosigmoid junction, and anus: Secondary | ICD-10-CM

## 2021-07-11 LAB — CBC WITH DIFFERENTIAL/PLATELET
Basophils Absolute: 0.1 10*3/uL (ref 0.0–0.1)
Basophils Relative: 0.6 % (ref 0.0–3.0)
Eosinophils Absolute: 0.1 10*3/uL (ref 0.0–0.7)
Eosinophils Relative: 1.1 % (ref 0.0–5.0)
HCT: 38.5 % — ABNORMAL LOW (ref 39.0–52.0)
Hemoglobin: 12.7 g/dL — ABNORMAL LOW (ref 13.0–17.0)
Lymphocytes Relative: 14.7 % (ref 12.0–46.0)
Lymphs Abs: 1.3 10*3/uL (ref 0.7–4.0)
MCHC: 32.9 g/dL (ref 30.0–36.0)
MCV: 89.4 fl (ref 78.0–100.0)
Monocytes Absolute: 1 10*3/uL (ref 0.1–1.0)
Monocytes Relative: 11.3 % (ref 3.0–12.0)
Neutro Abs: 6.5 10*3/uL (ref 1.4–7.7)
Neutrophils Relative %: 72.3 % (ref 43.0–77.0)
Platelets: 417 10*3/uL — ABNORMAL HIGH (ref 150.0–400.0)
RBC: 4.3 Mil/uL (ref 4.22–5.81)
RDW: 14.9 % (ref 11.5–15.5)
WBC: 8.9 10*3/uL (ref 4.0–10.5)

## 2021-07-11 LAB — COMPREHENSIVE METABOLIC PANEL
ALT: 10 U/L (ref 0–53)
AST: 22 U/L (ref 0–37)
Albumin: 4.3 g/dL (ref 3.5–5.2)
Alkaline Phosphatase: 45 U/L (ref 39–117)
BUN: 17 mg/dL (ref 6–23)
CO2: 29 mEq/L (ref 19–32)
Calcium: 9.5 mg/dL (ref 8.4–10.5)
Chloride: 100 mEq/L (ref 96–112)
Creatinine, Ser: 1.15 mg/dL (ref 0.40–1.50)
GFR: 61.3 mL/min (ref >60.00)
Glucose, Bld: 91 mg/dL (ref 70–99)
Potassium: 4.7 mEq/L (ref 3.5–5.1)
Sodium: 138 mEq/L (ref 135–145)
Total Bilirubin: 0.9 mg/dL (ref 0.2–1.2)
Total Protein: 7.2 g/dL (ref 6.0–8.3)

## 2021-07-11 LAB — C-REACTIVE PROTEIN: CRP: <1 mg/dL (ref 0.5–20.0)

## 2021-07-11 NOTE — Progress Notes (Signed)
Chief Complaint: Recurrent abdominal pain  Referring Provider:  Vernie Shanks, MD      ASSESSMENT AND PLAN;   #1. 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 H/O Rectal Ca s/p neoadjuvant chemo-XRT/APR 2003 followed by pulmonary isolated met s/p wedge resection 2011. Neg colon 12/2020  Plan: -CBC, CMP, CRP, CEA -MR enterography -Pt would make FU appt with Dr. Drue Flirt [facility]) -FU in 12 weeks.   HPI:    Jon Wallace is a 75 y.o. male  With H/O Rectal Ca s/p neoadjuvant chemo-XRT/APR 2003 followed by pulmonary isolated met s/p wedge resection 2011  With recurrent PSBOs s/p colostomy revision with LOA 04/20/2019 (Dr Drue Flirt)  Last colonoscopy 12/2020 @[place]  [facility]- neg. Was undergoing annual colonoscopies prior.  Adm with PSBO 9/10-9/13/2022, CT as below.  Managed conservatively with NG/IVF/serial KUBs. Sx consultation was sought-recommended conservative management for now.  Continued intermittent obstruction since APR- with episodic generalized Abdo pain/N/V x 24-48hrs, then gets better the frequency of 1-2 times per month. He goes on a clear liquid diet, takes a warm bath and his symptoms do get better.  Denies having any change in bowel habits-empties colostomy bag 3/day.  No hematochezia.  No significant weight loss.  No melena or hematochezia.  He denies having any heartburn, N/V (except during above episodes), odynophagia or dysphagia.  No fever chills or night sweats.     Recent GI work-up: CT AP with contrast 04/12/2021 1. Status post abdominal perineal resection for rectal cancer. 2. Mid small bowel dilatation, mild, favoring partial small bowel obstruction, most likely secondary to adhesions. 3. Short segment area of underdistention and apparent wall thickening within the dilated small bowel could represent concurrent enteritis. Adjacent surrounding mesenteric edema is nonspecific. No specific evidence of complicating ischemia. 4. Trace pelvic  fluid, likely secondary. 5. No findings of metastatic disease.  Last colonoscopy 12/2020 through stoma: neg for recurrence.   Past Medical History:  Diagnosis Date   Abdominal hernia    Cancer ([place])    Colon 2003 and Lung 2011    Past Surgical History:  Procedure Laterality Date   COLON SURGERY     COLOSTOMY     KNEE ARTHROSCOPY W/ MENISCAL REPAIR Left     Family History  Problem Relation Age of Onset   COPD Father     Social History   Tobacco Use   Smoking status: Former   Smokeless tobacco: Never  Vaping Use   Vaping Use: Never used  Substance Use Topics   Alcohol use: Not Currently   Drug use: Not Currently    Current Outpatient Medications  Medication Sig Dispense Refill   Simethicone (GAS-X PO) Take 1 capsule by mouth daily as needed (flatulence).     Vitamin D, Ergocalciferol, 50 MCG (2000 UT) CAPS Take 2,000 mg by mouth daily.     No current facility-administered medications for this visit.    Allergies  Allergen Reactions   Morphine And Related Nausea And Vomiting    Review of Systems:  Constitutional: Denies fever, chills, diaphoresis, appetite change and fatigue.  HEENT: Denies photophobia, eye pain, redness, hearing loss, ear pain, congestion, sore throat, rhinorrhea, sneezing, mouth sores, neck pain, neck stiffness and tinnitus.   Respiratory: Denies SOB, DOE, cough, chest tightness,  and wheezing.   Cardiovascular: Denies chest pain, palpitations and leg swelling.  Genitourinary: Denies dysuria, urgency, frequency, hematuria, flank pain and difficulty urinating.  Musculoskeletal: Denies myalgias, back pain, joint swelling, arthralgias and gait problem.  Skin: No  rash.  Neurological: Denies dizziness, seizures, syncope, weakness, light-headedness, numbness and headaches.  Hematological: Denies adenopathy. Easy bruising, personal or family bleeding history  Psychiatric/Behavioral: No anxiety or depression     Physical Exam:    BP 122/80 (BP  Location: Left Arm, Patient Position: Sitting, Cuff Size: Normal)   Pulse 84   Ht _0  (1.753 m)   Wt 139 lb 8 oz (63.3 kg)   SpO2 98%   BMI 20.60 kg/m  Wt Readings from Last 3 Encounters:  07/11/21 139 lb 8 oz (63.3 kg)  04/12/21 140 lb (63.5 kg)  05/14/12 138 lb (62.6 kg)   Constitutional:  Well-developed, in no acute distress. Psychiatric: Normal mood and affect. Behavior is normal. HEENT: Pupils normal.  Conjunctivae are normal. No scleral icterus. Cardiovascular: Normal rate, regular rhythm. No edema Pulmonary/chest: Effort normal and breath sounds normal. No wheezing, rales or rhonchi. Abdominal: Soft, nondistended. Nontender. Bowel sounds active throughout. There are no masses palpable. No hepatomegaly.  Colostomy without any definite hernia.  Well-healed surgical scars. Rectal: Deferred Neurological: Alert and oriented to person place and time. Skin: Skin is warm and dry. No rashes noted.  Data Reviewed: I have personally reviewed following labs and imaging studies  CBC: CBC Latest Ref Rng & Units 04/12/2021 05/14/2012  WBC 4.0 - 10.5 K/uL 7.9 7.6  Hemoglobin 13.0 - 17.0 g/dL 12.5(L) 13.1(A)  Hematocrit 39.0 - 52.0 % 37.3(L) 42.8(A)  Platelets 150 - 400 K/uL 398 -    CMP: CMP Latest Ref Rng & Units 04/12/2021 05/14/2012  Glucose 70 - 99 mg/dL 131(H) 84  BUN 8 - 23 mg/dL 17 21  Creatinine 0.61 - 1.24 mg/dL 1.20 0.92  Sodium 135 - 145 mmol/L 137 140  Potassium 3.5 - 5.1 mmol/L 4.1 5.0  Chloride 98 - 111 mmol/L 101 102  CO2 22 - 32 mmol/L 23 31  Calcium 8.9 - 10.3 mg/dL 9.8 9.5  Total Protein 6.5 - 8.1 g/dL 7.5 6.7  Total Bilirubin 0.3 - 1.2 mg/dL 1.1 0.7  Alkaline Phos 38 - 126 U/L 42 41  AST 15 - 41 U/L 25 18  ALT 0 - 44 U/L 14 19      Carmell Austria, MD 07/11/2021, 9:36 AM  Cc: Vernie Shanks, MD

## 2021-07-11 NOTE — Patient Instructions (Addendum)
  If you are age 77 or older, your body mass index should be between 23-30. Your Body mass index is 20.6 kg/m. If this is out of the aforementioned range listed, please consider follow up with your Primary Care Provider.  If you are age 34 or younger, your body mass index should be between 19-25. Your Body mass index is 20.6 kg/m. If this is out of the aformentioned range listed, please consider follow up with your Primary Care Provider.   ________________________________________________________  The [facility] providers would like to encourage you to use [facility] to communicate with providers for non-urgent requests or questions.  Due to long hold times on the telephone, sending your provider a message by [facility] may be a faster and more efficient way to get a response.  Please allow 48 business hours for a response.  Please remember that this is for non-urgent requests.  _______________________________________________________  Please go to the lab on the 2nd floor [address] before you leave the office today.    You have been scheduled for an MRI at [facility]  on Thursday 07-24-2021. Your appointment time is 10am. Please arrive to admitting (at main entrance of the hospital) 90 minutes prior to your appointment time for registration purposes. Please make certain not to have anything to eat or drink 6 hours prior to your test. In addition, if you have any metal in your body, have a pacemaker or defibrillator, please be sure to let your ordering physician know. This test typically takes 2 hours to complete. Should you need to reschedule, please call [phone] to do so.  Please call with any questions or concerns.   Thank you,  Dr. Jackquline Denmark

## 2021-07-24 ENCOUNTER — Ambulatory Visit (HOSPITAL_COMMUNITY)
Admission: RE | Admit: 2021-07-24 | Discharge: 2021-07-24 | Disposition: A | Discharge status: Home / Self Care | Payer: Medicare Other | Source: Ambulatory Visit | Attending: Gastroenterology | Admitting: Gastroenterology

## 2021-07-24 ENCOUNTER — Other Ambulatory Visit: Payer: Self-pay

## 2021-07-24 DIAGNOSIS — Z9889 Other specified postprocedural states: Secondary | ICD-10-CM | POA: Diagnosis not present

## 2021-07-24 DIAGNOSIS — K56600 Partial intestinal obstruction, unspecified as to cause: Secondary | ICD-10-CM

## 2021-07-24 DIAGNOSIS — Z9049 Acquired absence of other specified parts of digestive tract: Secondary | ICD-10-CM | POA: Diagnosis not present

## 2021-07-24 MED ORDER — GADOBUTROL 1 MMOL/ML IV SOLN
6.0000 mL | Freq: Once | INTRAVENOUS | Status: AC | PRN
  Administered 2021-07-24: 11:00:00 6 mL via INTRAVENOUS

## 2021-08-05 ENCOUNTER — Encounter: Payer: Self-pay | Admitting: Gastroenterology

## 2021-08-05 NOTE — Telephone Encounter (Signed)
Pt stated that he would like to discuss further treatment with Dr. Lyndel Safe. Appointment scheduled for 08/26/2021 @ 2:10 with Dr. Lyndel Safe in [facility]. Pt made aware Pt verbalized understanding with all questions answered.

## 2021-08-26 ENCOUNTER — Encounter: Payer: Self-pay | Admitting: Gastroenterology

## 2021-08-26 ENCOUNTER — Other Ambulatory Visit: Payer: Self-pay

## 2021-08-26 ENCOUNTER — Ambulatory Visit (INDEPENDENT_AMBULATORY_CARE_PROVIDER_SITE_OTHER): Payer: Medicare Other | Admitting: Gastroenterology

## 2021-08-26 VITALS — BP 124/80 | HR 70 | Ht 69.0 in | Wt 144.2 lb

## 2021-08-26 DIAGNOSIS — Z85048 Personal history of other malignant neoplasm of rectum, rectosigmoid junction, and anus: Secondary | ICD-10-CM | POA: Diagnosis not present

## 2021-08-26 DIAGNOSIS — K56600 Partial intestinal obstruction, unspecified as to cause: Secondary | ICD-10-CM

## 2021-08-26 NOTE — Progress Notes (Signed)
Chief Complaint: Recurrent abdominal pain  Referring Provider:  Vernie Shanks, MD      ASSESSMENT AND PLAN;   #1. 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 Neg MRE 07/2021  #2. H/O Rectal Ca s/p neoadjuvant chemo-XRT/APR 2003 followed by pulmonary isolated met s/p wedge resection 2011. Neg colon 12/2020.   Plan: -CT AP woth PO/IV at time of symptoms (prefer CTE but would be hard to arrange). Otherwise hold off -CBC, CMP just prior. -Encouraged to continue high-protein diet. -Continue walking 2-3 miles/day. -Call us in case of any problems.   HPI:    Jon Wallace is a 76 y.o. male  With H/O Rectal Ca s/p neoadjuvant chemo-XRT/APR 2003 followed by pulmonary isolated met s/p wedge resection 2011  With recurrent PSBOs s/p colostomy revision with LOA 04/20/2019 (Dr Drue Flirt)  Last colonoscopy 12/2020 _0  [facility]- neg. Was undergoing annual colonoscopies prior.  No further episodes of episodic abdominal pain.  He has gained 4 pounds as below.  He has been eating better-mostly high-protein diet and has been walking 2 to 3 miles per day.  Pleased with the progress.  Wt Readings from Last 3 Encounters:  08/26/21 144 lb 4 oz (65.4 kg)  07/11/21 139 lb 8 oz (63.3 kg)  04/12/21 140 lb (63.5 kg)     From previous notes  Adm with PSBO 9/10-9/13/2022, CT as below.  Managed conservatively with NG/IVF/serial KUBs. Sx consultation was sought-recommended conservative management for now.  Continued intermittent obstruction since APR- with episodic generalized Abdo pain/N/V x 24-48hrs, then gets better the frequency of 1-2 times per month. He goes on a clear liquid diet, takes a warm bath and his symptoms do get better.  No melena or hematochezia.  He denies having any heartburn, N/V (except during above episodes), odynophagia or dysphagia.  No fever chills or night sweats.   Recent GI work-up: CT AP with contrast 04/12/2021 1. Status post abdominal perineal resection for  rectal cancer. 2. Mid small bowel dilatation, mild, favoring partial small bowel obstruction, most likely secondary to adhesions. 3. Short segment area of underdistention and apparent wall thickening within the dilated small bowel could represent concurrent enteritis. Adjacent surrounding mesenteric edema is nonspecific. No specific evidence of complicating ischemia. 4. Trace pelvic fluid, likely secondary. 5. No findings of metastatic disease.  Last colonoscopy 12/2020 through stoma: neg for recurrence.  MRE 07/25/2021 IMPRESSION: 1. Evaluation of the small bowel is somewhat limited by motion artifact and underdistention. Within this limitation, no evidence of small-bowel obstruction or inflammatory findings. Given the motion limitations of this examination, CT enterography may be helpful for better evaluation if there is a high clinical suspicion for inflammatory bowel disease. 2. Unchanged postoperative/posttreatment appearance of the low pelvis status post abdominoperineal resection with left lower quadrant end colostomy. No evidence of malignant recurrence or metastatic disease in the abdomen or pelvis.   Past Medical History:  Diagnosis Date   Abdominal hernia    Cancer ([place])    Colon 2003 and Lung 2011    Past Surgical History:  Procedure Laterality Date   COLON SURGERY     COLOSTOMY     KNEE ARTHROSCOPY W/ MENISCAL REPAIR Left     Family History  Problem Relation Age of Onset   COPD Father    Throat cancer Father    Thyroid cancer Sister        and jaw cancer   Colon cancer Paternal Uncle    Stomach cancer Cousin    Pancreatic cancer  Neg Hx     Social History   Tobacco Use   Smoking status: Former   Smokeless tobacco: Never  Scientific laboratory technician Use: Never used  Substance Use Topics   Alcohol use: Not Currently   Drug use: Not Currently    Current Outpatient Medications  Medication Sig Dispense Refill   Simethicone (GAS-X PO) Take 1 capsule by  mouth daily as needed (flatulence).     Vitamin D, Ergocalciferol, 50 MCG (2000 UT) CAPS Take 2,000 mg by mouth daily.     No current facility-administered medications for this visit.    Allergies  Allergen Reactions   Morphine And Related Nausea And Vomiting    Review of Systems:  neg     Physical Exam:    BP 124/80    Pulse 70    Ht _0  (1.753 m)    Wt 144 lb 4 oz (65.4 kg)    SpO2 99%    BMI 21.30 kg/m  Wt Readings from Last 3 Encounters:  08/26/21 144 lb 4 oz (65.4 kg)  07/11/21 139 lb 8 oz (63.3 kg)  04/12/21 140 lb (63.5 kg)   Constitutional:  Well-developed, in no acute distress. Psychiatric: Normal mood and affect. Behavior is normal. HEENT: Pupils normal.  Conjunctivae are normal. No scleral icterus. Cardiovascular: Normal rate, regular rhythm. No edema Pulmonary/chest: Effort normal and breath sounds normal. No wheezing, rales or rhonchi. Abdominal: Soft, nondistended. Nontender. Bowel sounds active throughout. There are no masses palpable. No hepatomegaly.  Colostomy without any definite hernia.  Well-healed surgical scars. Rectal: Deferred Neurological: Alert and oriented to person place and time. Skin: Skin is warm and dry. No rashes noted.  Data Reviewed: I have personally reviewed following labs and imaging studies  CBC: CBC Latest Ref Rng & Units 07/11/2021 04/12/2021 05/14/2012  WBC 4.0 - 10.5 K/uL 8.9 7.9 7.6  Hemoglobin 13.0 - 17.0 g/dL 12.7(L) 12.5(L) 13.1(A)  Hematocrit 39.0 - 52.0 % 38.5(L) 37.3(L) 42.8(A)  Platelets 150.0 - 400.0 K/uL 417.0(H) 398 -    CMP: CMP Latest Ref Rng & Units 07/11/2021 04/12/2021 05/14/2012  Glucose 70 - 99 mg/dL 91 131(H) 84  BUN 6 - 23 mg/dL _1 Creatinine 0.40 - 1.50 mg/dL 1.15 1.20 0.92  Sodium 135 - 145 mEq/L 138 137 140  Potassium 3.5 - 5.1 mEq/L 4.7 4.1 5.0  Chloride 96 - 112 mEq/L 100 101 102  CO2 19 - 32 mEq/L _2 Calcium 8.4 - 10.5 mg/dL 9.5 9.8 9.5  Total Protein 6.0 - 8.3 g/dL 7.2 7.5 6.7   Total Bilirubin 0.2 - 1.2 mg/dL 0.9 1.1 0.7  Alkaline Phos 39 - 117 U/L 45 42 41  AST 0 - 37 U/L _3 ALT 0 - 53 U/L _4 Carmell Austria, MD 08/26/2021, 2:22 PM  Cc: Vernie Shanks, MD

## 2021-08-26 NOTE — Patient Instructions (Signed)
If you are age 78 or older, your body mass index should be between 23-30. Your Body mass index is 21.3 kg/m. If this is out of the aforementioned range listed, please consider follow up with your Primary Care Provider.  If you are age 73 or younger, your body mass index should be between 19-25. Your Body mass index is 21.3 kg/m. If this is out of the aformentioned range listed, please consider follow up with your Primary Care Provider.   __________________________________________________________  The [facility] providers would like to encourage you to use [facility] to communicate with providers for non-urgent requests or questions.  Due to long hold times on the telephone, sending your provider a message by [facility] may be a faster and more efficient way to get a response.  Please allow 48 business hours for a response.  Please remember that this is for non-urgent requests.    Call to get CT scheduled at time of symptoms   You have been scheduled for a CT scan of the abdomen and pelvis at [place], [place]  [facility]).   You are scheduled on            at         . You should arrive 15 minutes prior to your appointment time for registration. Please follow the written instructions below on the day of your exam:  WARNING: IF YOU ARE ALLERGIC TO IODINE/X-RAY DYE, PLEASE NOTIFY RADIOLOGY IMMEDIATELY AT [phone]! YOU WILL BE GIVEN A 13 HOUR PREMEDICATION PREP.  1) Do not eat or drink anything after           (4 hours prior to your test) 2) You have been given 2 bottles of oral contrast to drink. The solution may taste better if refrigerated, but do NOT add ice or any other liquid to this solution. Shake well before drinking.    Drink 1 bottle of contrast @             (2 hours prior to your exam)  Drink 1 bottle of contrast @             (1 hour prior to your exam)  You may take any medications as prescribed with a small amount of water, if  necessary. If you take any of the following medications: METFORMIN, GLUCOPHAGE, GLUCOVANCE, AVANDAMET, RIOMET, FORTAMET, [place]ril MET, JANUMET, GLUMETZA or METAGLIP, you MAY be asked to HOLD this medication 48 hours AFTER the exam.  The purpose of you drinking the oral contrast is to aid in the visualization of your intestinal tract. The contrast solution may cause some diarrhea. Depending on your individual set of symptoms, you may also receive an intravenous injection of x-ray contrast/dye. Plan on being at [facility] for 30 minutes or longer, depending on the type of exam you are having performed.  This test typically takes 30-45 minutes to complete.  If you have any questions regarding your exam or if you need to reschedule, you may call the CT department at [phone] between the hours of 8:00 am and 5:00 pm, Monday-Friday.  ________________________________________________________________________

## 2021-09-12 DIAGNOSIS — Z23 Encounter for immunization: Secondary | ICD-10-CM | POA: Diagnosis not present

## 2021-09-12 DIAGNOSIS — Z1389 Encounter for screening for other disorder: Secondary | ICD-10-CM | POA: Diagnosis not present

## 2021-09-12 DIAGNOSIS — Z0000 Encounter for general adult medical examination without abnormal findings: Secondary | ICD-10-CM | POA: Diagnosis not present

## 2021-09-21 ENCOUNTER — Emergency Department (HOSPITAL_COMMUNITY): Payer: Medicare Other

## 2021-09-21 ENCOUNTER — Other Ambulatory Visit: Payer: Self-pay

## 2021-09-21 ENCOUNTER — Encounter (HOSPITAL_COMMUNITY): Payer: Self-pay | Admitting: Emergency Medicine

## 2021-09-21 ENCOUNTER — Inpatient Hospital Stay (HOSPITAL_COMMUNITY)
Admission: EM | Admit: 2021-09-21 | Discharge: 2021-09-23 | DRG: 390 | Disposition: A | Discharge status: Home / Self Care | Payer: Medicare Other | Attending: Internal Medicine | Admitting: Internal Medicine

## 2021-09-21 DIAGNOSIS — Z933 Colostomy status: Secondary | ICD-10-CM | POA: Diagnosis not present

## 2021-09-21 DIAGNOSIS — Z7982 Long term (current) use of aspirin: Secondary | ICD-10-CM | POA: Diagnosis not present

## 2021-09-21 DIAGNOSIS — Z79899 Other long term (current) drug therapy: Secondary | ICD-10-CM | POA: Diagnosis not present

## 2021-09-21 DIAGNOSIS — K56600 Partial intestinal obstruction, unspecified as to cause: Secondary | ICD-10-CM | POA: Diagnosis not present

## 2021-09-21 DIAGNOSIS — R1084 Generalized abdominal pain: Secondary | ICD-10-CM | POA: Diagnosis not present

## 2021-09-21 DIAGNOSIS — R109 Unspecified abdominal pain: Secondary | ICD-10-CM | POA: Diagnosis present

## 2021-09-21 DIAGNOSIS — Z20822 Contact with and (suspected) exposure to covid-19: Secondary | ICD-10-CM | POA: Diagnosis present

## 2021-09-21 DIAGNOSIS — R1111 Vomiting without nausea: Secondary | ICD-10-CM | POA: Diagnosis not present

## 2021-09-21 DIAGNOSIS — R609 Edema, unspecified: Secondary | ICD-10-CM | POA: Diagnosis not present

## 2021-09-21 DIAGNOSIS — K56609 Unspecified intestinal obstruction, unspecified as to partial versus complete obstruction: Secondary | ICD-10-CM | POA: Diagnosis not present

## 2021-09-21 DIAGNOSIS — Z885 Allergy status to narcotic agent status: Secondary | ICD-10-CM

## 2021-09-21 DIAGNOSIS — Z85048 Personal history of other malignant neoplasm of rectum, rectosigmoid junction, and anus: Secondary | ICD-10-CM | POA: Diagnosis present

## 2021-09-21 DIAGNOSIS — Z9049 Acquired absence of other specified parts of digestive tract: Secondary | ICD-10-CM | POA: Diagnosis not present

## 2021-09-21 DIAGNOSIS — K5669 Other partial intestinal obstruction: Secondary | ICD-10-CM | POA: Diagnosis not present

## 2021-09-21 DIAGNOSIS — Z87891 Personal history of nicotine dependence: Secondary | ICD-10-CM

## 2021-09-21 DIAGNOSIS — R1030 Lower abdominal pain, unspecified: Secondary | ICD-10-CM | POA: Diagnosis not present

## 2021-09-21 DIAGNOSIS — Z800 Family history of malignant neoplasm of digestive organs: Secondary | ICD-10-CM | POA: Diagnosis not present

## 2021-09-21 LAB — COMPREHENSIVE METABOLIC PANEL
ALT: 12 U/L (ref 0–44)
AST: 21 U/L (ref 15–41)
Albumin: 3.9 g/dL (ref 3.5–5.0)
Alkaline Phosphatase: 40 U/L (ref 38–126)
Anion gap: 7 (ref 5–15)
BUN: 22 mg/dL (ref 8–23)
CO2: 26 mmol/L (ref 22–32)
Calcium: 8.6 mg/dL — ABNORMAL LOW (ref 8.9–10.3)
Chloride: 102 mmol/L (ref 98–111)
Creatinine, Ser: 1.06 mg/dL (ref 0.61–1.24)
GFR, Estimated: >60 mL/min (ref >60)
Glucose, Bld: 118 mg/dL — ABNORMAL HIGH (ref 70–99)
Potassium: 4.1 mmol/L (ref 3.5–5.1)
Sodium: 135 mmol/L (ref 135–145)
Total Bilirubin: 0.6 mg/dL (ref 0.3–1.2)
Total Protein: 7.3 g/dL (ref 6.5–8.1)

## 2021-09-21 LAB — CBC WITH DIFFERENTIAL/PLATELET
Abs Immature Granulocytes: 0.04 10*3/uL (ref 0.00–0.07)
Basophils Absolute: 0.1 10*3/uL (ref 0.0–0.1)
Basophils Relative: 1 %
Eosinophils Absolute: 0 10*3/uL (ref 0.0–0.5)
Eosinophils Relative: 0 %
HCT: 38.1 % — ABNORMAL LOW (ref 39.0–52.0)
Hemoglobin: 12.6 g/dL — ABNORMAL LOW (ref 13.0–17.0)
Immature Granulocytes: 0 %
Lymphocytes Relative: 10 %
Lymphs Abs: 1 10*3/uL (ref 0.7–4.0)
MCH: 29.9 pg (ref 26.0–34.0)
MCHC: 33.1 g/dL (ref 30.0–36.0)
MCV: 90.3 fL (ref 80.0–100.0)
Monocytes Absolute: 0.9 10*3/uL (ref 0.1–1.0)
Monocytes Relative: 9 %
Neutro Abs: 7.7 10*3/uL (ref 1.7–7.7)
Neutrophils Relative %: 80 %
Platelets: 475 10*3/uL — ABNORMAL HIGH (ref 150–400)
RBC: 4.22 MIL/uL (ref 4.22–5.81)
RDW: 13.8 % (ref 11.5–15.5)
WBC: 9.6 10*3/uL (ref 4.0–10.5)
nRBC: 0 % (ref 0.0–0.2)

## 2021-09-21 LAB — TYPE AND SCREEN
ABO/RH(D): O POS
Antibody Screen: NEGATIVE

## 2021-09-21 LAB — LIPASE, BLOOD: Lipase: 30 U/L (ref 11–51)

## 2021-09-21 LAB — RESP PANEL BY RT-PCR (FLU A&B, COVID) ARPGX2
Influenza A by PCR: NEGATIVE
Influenza B by PCR: NEGATIVE
SARS Coronavirus 2 by RT PCR: NEGATIVE

## 2021-09-21 MED ORDER — ONDANSETRON HCL 4 MG/2ML IJ SOLN: 4.0000 mg | Freq: Once | INTRAMUSCULAR | Status: DC

## 2021-09-21 MED ORDER — HYDROMORPHONE HCL 1 MG/ML IJ SOLN
0.5000 mg | Freq: Once | INTRAMUSCULAR | Status: AC
  Administered 2021-09-21: 0.5 mg via INTRAVENOUS
  Filled 2021-09-21: qty 1

## 2021-09-21 MED ORDER — PANTOPRAZOLE SODIUM 40 MG IV SOLR
40.0000 mg | INTRAVENOUS | Status: DC
Start: 2021-09-21 — End: 2021-09-23
  Administered 2021-09-21 – 2021-09-22 (×2): 40 mg via INTRAVENOUS
  Filled 2021-09-21 (×2): qty 10

## 2021-09-21 MED ORDER — BISACODYL 5 MG PO TBEC
5.0000 mg | DELAYED_RELEASE_TABLET | Freq: Every day | ORAL | Status: DC
  Administered 2021-09-21 – 2021-09-22 (×2): 5 mg via ORAL
  Filled 2021-09-21 (×2): qty 1

## 2021-09-21 MED ORDER — PROCHLORPERAZINE EDISYLATE 10 MG/2ML IJ SOLN: 10.0000 mg | Freq: Four times a day (QID) | INTRAMUSCULAR | Status: DC | PRN

## 2021-09-21 MED ORDER — LACTULOSE 10 GM/15ML PO SOLN
30.0000 g | Freq: Two times a day (BID) | ORAL | Status: DC
  Administered 2021-09-21 – 2021-09-22 (×3): 30 g via ORAL
  Filled 2021-09-21 (×3): qty 60

## 2021-09-21 MED ORDER — HYDROMORPHONE HCL 1 MG/ML IJ SOLN
0.5000 mg | INTRAMUSCULAR | Status: DC | PRN
Start: 2021-09-21 — End: 2021-09-23
  Administered 2021-09-21 (×3): 0.5 mg via INTRAVENOUS
  Filled 2021-09-21 (×3): qty 1

## 2021-09-21 MED ORDER — SODIUM CHLORIDE 0.9 % IV BOLUS
500.0000 mL | Freq: Once | INTRAVENOUS | Status: AC
  Administered 2021-09-21: 500 mL via INTRAVENOUS

## 2021-09-21 MED ORDER — SODIUM CHLORIDE 0.9 % IV SOLN: INTRAVENOUS | Status: DC

## 2021-09-21 MED ORDER — IOHEXOL 300 MG/ML  SOLN
100.0000 mL | Freq: Once | INTRAMUSCULAR | Status: AC | PRN
  Administered 2021-09-21: 100 mL via INTRAVENOUS

## 2021-09-21 NOTE — ED Notes (Signed)
Pt transported to CT ?

## 2021-09-21 NOTE — H&P (Signed)
History and Physical    Patient: Jon Wallace XTG:[id number] DOB: 07-May-1946 DOA: 09/21/2021 DOS: the patient was seen and examined on 09/21/2021 PCP: Vernie Shanks, MD  Patient coming from: Home  Chief Complaint:  Chief Complaint  Patient presents with   Abdominal Pain    HPI: Jon Wallace is a 76 y.o. male with medical history significant of colon cancer s/p colectomy now w/ colostomy, recurrent SBO. Presenting with abdominal pain, N/V that started 2 days ago. It has been an constant, intense and crampy pain. He has noticed that his colostomy has had little to no output for the last 3 days. He reports he takes a capful of miralax daily, but otherwise has not tried any increase in medications. He has tried warm baths, but they have not help. When his symptoms didn't improve this morning, he decided to come to the ED for help. He denies any other aggravating or alleviating factors.   Review of Systems: As mentioned in the history of present illness. All other systems reviewed and are negative. Past Medical History:  Diagnosis Date   Abdominal hernia    Cancer ([place])    Colon 2003 and Lung 2011   Past Surgical History:  Procedure Laterality Date   COLON SURGERY     COLOSTOMY     KNEE ARTHROSCOPY W/ MENISCAL REPAIR Left    Social History:  reports that he has quit smoking. He has never used smokeless tobacco. He reports that he does not currently use alcohol. He reports that he does not currently use drugs.  Allergies  Allergen Reactions   Morphine And Related Nausea And Vomiting    Family History  Problem Relation Age of Onset   COPD Father    Throat cancer Father    Thyroid cancer Sister        and jaw cancer   Colon cancer Paternal Uncle    Stomach cancer Cousin    Pancreatic cancer Neg Hx     Prior to Admission medications   Not on File    Physical Exam: Vitals:   09/21/21 0955 09/21/21 1100 09/21/21 1112 09/21/21 1203  BP: 131/81 115/69  119/71  Pulse:   78 78 81  Resp:  15 16 16   Temp:      TempSrc:      SpO2:  96% 96% 100%   General: 76 y.o. male resting in bed in NAD Eyes: PERRL, normal sclera ENMT: Nares patent w/o discharge, orophaynx clear, dentition normal, ears w/o discharge/lesions/ulcers Neck: Supple, trachea midline Cardiovascular: RRR, +S1, S2, no m/g/r, equal pulses throughout Respiratory: CTABL, no w/r/r, normal WOB GI: BS+, ND, LLQ mild TTP, no masses noted, no organomegaly noted, colostomy noted MSK: No e/c/c Neuro: A&O x 3, no focal deficits Psyc: Appropriate interaction and affect, calm/cooperative   Data Reviewed:  WBC  9.6 Scr 1.06  CT ab/pelvis: 1. Small bowel loops in the mid abdomen are mildly distended up to 3.4 cm diameter. There is some transmural hyperenhancement of small bowel in the pelvis. A small bowel loop with fecalized contents is identified in the anterior pelvis that abruptly tapers into a nondilated loop the contains only gas but no enteric contents. This is in the same region as the hyperenhancing bowel seen on CT scan from 04/12/2021 and is just deep to the anterior peritoneal lining. Imaging features are compatible with a small bowel obstruction. Distal ileum beyond this point is decompressed.  Assessment and Plan: No notes have been filed under this  hospital service. Service: Hospitalist SBO     - place in obs, med-surg     - EDP spoke with [facility] (Dr. Marcello Moores); rec'd NGT placement and SBO protocol     - patient is currently declining to have an NGT placed     - will give anti-emetics, lactulose, and ducolax     - continue fluids     - NPO except meds/ice chips  Hx of colon CA s/p resection     - continue outpt follow up  Advance Care Planning:   Code Status: FULL   Consults: EDP spoke General Surgery (Dr. Marcello Moores)  Family Communication: w/ dtr at bedside  Severity of Illness: The appropriate patient status for this patient is OBSERVATION. Observation status is judged to be reasonable  and necessary in order to provide the required intensity of service to ensure the patient's safety. The patient's presenting symptoms, physical exam findings, and initial radiographic and laboratory data in the context of their medical condition is felt to place them at decreased risk for further clinical deterioration. Furthermore, it is anticipated that the patient will be medically stable for discharge from the hospital within 2 midnights of admission.   Author: Jonnie Finner, DO 09/21/2021 12:10 PM  For on call review [email or web address].

## 2021-09-21 NOTE — ED Provider Notes (Signed)
[facility] DEPT Provider Note   CSN: [id number] Arrival date & time: 09/21/21  6734     History  Chief Complaint  Patient presents with   Abdominal Pain    Jon Wallace is a 76 y.o. male.  76 year old male with prior medical history as detailed below presents for evaluation.  Patient reports symptoms of small bowel obstruction.  Patient reports minimal to no output from colostomy for the last 3 days.  Patient complains of diffuse abdominal pain and associated nausea and vomiting.  Patient with prior history of SBO.  Patient denies fever.  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  Patient reports most recent surgical intervention was colostomy revision with lysis of adhesions in 2020    The history is provided by the patient and medical records.  Abdominal Pain Pain location:  Generalized Pain quality: aching and cramping   Pain radiates to:  Does not radiate Pain severity:  Mild Onset quality:  Gradual Duration:  3 days Timing:  Constant Progression:  Worsening Chronicity:  Recurrent     Home Medications Prior to Admission medications   Medication Sig Start Date End Date Taking? Authorizing Provider  aspirin 81 MG chewable tablet Chew 81 mg by mouth daily.    [provider]  Lutein 20 MG TABS Take 20 mg by mouth daily.    [provider]  Simethicone (GAS-X PO) Take 1 capsule by mouth daily as needed (flatulence).    [provider]  Vitamin D, Ergocalciferol, 50 MCG (2000 UT) CAPS Take 2,000 mg by mouth daily. 04/06/13   [provider]      Allergies    Morphine and related    Review of Systems   Review of Systems  Gastrointestinal:  Positive for abdominal pain.  All other systems reviewed and are negative.  Physical Exam Updated Vital Signs BP 115/69    Pulse 78     Temp 98.5 F (36.9 C) (Oral)    Resp 16    SpO2 96%  Physical Exam Vitals and nursing note reviewed.  Constitutional:      General: He is not in acute distress.    Appearance: Normal appearance. He is well-developed.  HENT:     Head: Normocephalic and atraumatic.  Eyes:     Conjunctiva/sclera: Conjunctivae normal.     Pupils: Pupils are equal, round, and reactive to light.  Cardiovascular:     Rate and Rhythm: Normal rate and regular rhythm.     Heart sounds: Normal heart sounds.  Pulmonary:     Effort: Pulmonary effort is normal. No respiratory distress.     Breath sounds: Normal breath sounds.  Abdominal:     General: There is no distension.     Palpations: Abdomen is soft.     Tenderness: There is generalized abdominal tenderness.     Comments: Colostomy site is intact.  No output noted in bag  Musculoskeletal:        General: No deformity. Normal range of motion.     Cervical back: Normal range of motion and neck supple.  Skin:    General: Skin is warm and dry.  Neurological:     General: No focal deficit present.     Mental Status: He is alert and oriented to person, place, and time.    ED Results / Procedures / Treatments   Labs (all labs  ordered are listed, but only abnormal results are displayed) Labs Reviewed  COMPREHENSIVE METABOLIC PANEL - Abnormal; Notable for the following components:      Result Value   Glucose, Bld 118 (*)    Calcium 8.6 (*)    All other components within normal limits  CBC WITH DIFFERENTIAL/PLATELET - Abnormal; Notable for the following components:   Hemoglobin 12.6 (*)    HCT 38.1 (*)    Platelets 475 (*)    All other components within normal limits  RESP PANEL BY RT-PCR (FLU A&B, COVID) ARPGX2  LIPASE, BLOOD  TYPE AND SCREEN  ABO/RH    EKG None  Radiology CT ABDOMEN PELVIS W CONTRAST  Result Date: 09/21/2021 CLINICAL DATA:  History of colon cancer. Status post colostomy. No stool for 3 days. Vomiting and abdominal pain.  EXAM: CT ABDOMEN AND PELVIS WITH CONTRAST TECHNIQUE: Multidetector CT imaging of the abdomen and pelvis was performed using the standard protocol following bolus administration of intravenous contrast. RADIATION DOSE REDUCTION: This exam was performed according to the departmental dose-optimization program which includes automated exposure control, adjustment of the mA and/or kV according to patient size and/or use of iterative reconstruction technique. CONTRAST:  182mL OMNIPAQUE IOHEXOL 300 MG/ML  SOLN COMPARISON:  04/12/2021 FINDINGS: Lower chest: Surgical scarring noted posterior right lower lobe. Hepatobiliary: No suspicious focal abnormality within the liver parenchyma. Small area of low attenuation in the anterior liver, adjacent to the falciform ligament, is in a characteristic location for focal fatty deposition. There is no evidence for gallstones, gallbladder wall thickening, or pericholecystic fluid. No intrahepatic or extrahepatic biliary dilation. Pancreas: No focal mass lesion. No dilatation of the main duct. No intraparenchymal cyst. No peripancreatic edema. Spleen: No splenomegaly. No focal mass lesion. Adrenals/Urinary Tract: No adrenal nodule or mass. Small cyst noted right kidney. Left kidney unremarkable. No evidence for hydroureter. The urinary bladder appears normal for the degree of distention. Stomach/Bowel: Stomach is unremarkable. No gastric wall thickening. No evidence of outlet obstruction. Duodenum is normally positioned as is the ligament of Treitz. Small bowel loops in the mid abdomen are mildly distended up to 3.4 cm diameter. There is some transmural hyperenhancement of small bowel in the pelvis. A loop with fecalized contents is identified in the anterior pelvis (image 57/2 that abruptly tapers in small bowel distal to this point contains only gas (well demonstrated on coronal image 40/4). This is in the same region as the hyperenhancing bowel seen on CT scan from 04/12/2021 and is  just deep to the anterior peritoneum. Distal and terminal ileum is decompressed. Colon is diffusely decompressed with left abdominal sigmoid end colostomy noted. Surgical changes in the pelvis compatible with APR. Vascular/Lymphatic: No abdominal aortic aneurysm. There is no gastrohepatic or hepatoduodenal ligament lymphadenopathy. No retroperitoneal or mesenteric lymphadenopathy. No pelvic sidewall lymphadenopathy. Reproductive: Unremarkable. Other: Trace intraperitoneal free fluid evident. Musculoskeletal: No worrisome lytic or sclerotic osseous abnormality. IMPRESSION: 1. Small bowel loops in the mid abdomen are mildly distended up to 3.4 cm diameter. There is some transmural hyperenhancement of small bowel in the pelvis. A small bowel loop with fecalized contents is identified in the anterior pelvis that abruptly tapers into a nondilated loop the contains only gas but no enteric contents. This is in the same region as the hyperenhancing bowel seen on CT scan from 04/12/2021 and is just deep to the anterior peritoneal lining. Imaging features are compatible with a small bowel obstruction. Distal ileum beyond this point is decompressed. 2. Trace intraperitoneal free fluid. 3.  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 Electronically Signed   By: Misty Stanley M.D.   On: 09/21/2021 11:32    Procedures Procedures    Medications Ordered in ED Medications  ondansetron (ZOFRAN) injection 4 mg (0 mg Intravenous Hold 09/21/21 1006)  sodium chloride 0.9 % bolus 500 mL (0 mLs Intravenous Stopped 09/21/21 1111)  HYDROmorphone (DILAUDID) injection 0.5 mg (0.5 mg Intravenous Given 09/21/21 1025)  iohexol (OMNIPAQUE) 300 MG/ML solution 100 mL (100 mLs Intravenous Contrast Given 09/21/21 1110)    ED Course/ Medical Decision Making/ A&P Clinical Course as of 09/21/21 1203  Sun Sep 21, 2021  1158 CT ABDOMEN PELVIS W CONTRAST [PM]    Clinical Course User Index [PM] Valarie Merino, MD                            Medical Decision Making Amount and/or Complexity of Data Reviewed Labs: ordered. Radiology: ordered.  Risk Prescription drug management.    Medical Screen Complete  This patient presented to the ED with complaint of abdominal pain, concern for small bowel obstruction.  This complaint involves an extensive number of treatment options. The initial differential diagnosis includes, but is not limited to, small bowel obstruction, other intra-abdominal pathology, etc.  This presentation is: Acute, Self-Limited, Previously Undiagnosed, Uncertain Prognosis, Complicated, Systemic Symptoms, and Threat to Life/Bodily Function  Patient with prior history significant for prior small bowel obstruction.  Patient presents with complaint of increased abdominal pain, decreased colostomy output, and associated nausea and vomiting.  History and exam are suggestive of likely bowel obstruction.  CT AP demonstrates evidence of bowel obstruction.  Case and images discussed with Dr. Marcello Moores of general surgery.  She recommends NG tube, IVF, bowel prep, admission to medicine.  Case discussed with hospitalist service who will evaluate for admission.   Co morbidities that complicated the patient's evaluation  History of rectal cancer, status postresection, status post colostomy   Additional history obtained:  Additional history obtained from [facility] External records from outside sources obtained and reviewed including prior ED visits and prior Inpatient records.    Lab Tests:  I ordered and personally interpreted labs.  The pertinent results include: CBC, CMP, lipase, COVID, flu   Imaging Studies ordered:  I ordered imaging studies including CT abdomen pelvis I independently visualized and interpreted obtained imaging which showed likely SBO I agree with the radiologist interpretation.   Cardiac Monitoring:  The patient was maintained on a cardiac monitor.  I personally viewed  and interpreted the cardiac monitor which showed an underlying rhythm of: NSR   Medicines ordered:  I ordered medication including Zofran, Dilaudid for pain, nausea Reevaluation of the patient after these medicines showed that the patient: improved   Problem List / ED Course:  Small bowel obstruction   Reevaluation:  After the interventions noted above, I reevaluated the patient and found that they have: improved  Disposition:  After consideration of the diagnostic results and the patients response to treatment, I feel that the patent would benefit from admission.          Final Clinical Impression(s) / ED Diagnoses Final diagnoses:  Small bowel obstruction [facility])    Rx / DC Orders ED Discharge Orders     None         Valarie Merino, MD 09/21/21 1217

## 2021-09-21 NOTE — ED Triage Notes (Signed)
Patient Jon Wallace,  History of colon cancer. S/p colostomy. Patient reports no stool for 3 days. Endorses vomiting/abd pain x 3 days. Swelling and tenderness to abdomen. VS WNL with ems. Patient reports last time this happened he had a blockage.

## 2021-09-22 ENCOUNTER — Encounter (HOSPITAL_COMMUNITY): Payer: Self-pay | Admitting: Internal Medicine

## 2021-09-22 DIAGNOSIS — K56609 Unspecified intestinal obstruction, unspecified as to partial versus complete obstruction: Secondary | ICD-10-CM | POA: Diagnosis not present

## 2021-09-22 DIAGNOSIS — Z79899 Other long term (current) drug therapy: Secondary | ICD-10-CM | POA: Diagnosis not present

## 2021-09-22 DIAGNOSIS — Z20822 Contact with and (suspected) exposure to covid-19: Secondary | ICD-10-CM | POA: Diagnosis present

## 2021-09-22 DIAGNOSIS — Z85048 Personal history of other malignant neoplasm of rectum, rectosigmoid junction, and anus: Secondary | ICD-10-CM | POA: Diagnosis not present

## 2021-09-22 DIAGNOSIS — K56600 Partial intestinal obstruction, unspecified as to cause: Secondary | ICD-10-CM | POA: Diagnosis present

## 2021-09-22 DIAGNOSIS — Z87891 Personal history of nicotine dependence: Secondary | ICD-10-CM | POA: Diagnosis not present

## 2021-09-22 DIAGNOSIS — Z9049 Acquired absence of other specified parts of digestive tract: Secondary | ICD-10-CM | POA: Diagnosis not present

## 2021-09-22 DIAGNOSIS — Z800 Family history of malignant neoplasm of digestive organs: Secondary | ICD-10-CM | POA: Diagnosis not present

## 2021-09-22 DIAGNOSIS — R109 Unspecified abdominal pain: Secondary | ICD-10-CM | POA: Diagnosis present

## 2021-09-22 DIAGNOSIS — Z7982 Long term (current) use of aspirin: Secondary | ICD-10-CM | POA: Diagnosis not present

## 2021-09-22 DIAGNOSIS — K5669 Other partial intestinal obstruction: Secondary | ICD-10-CM | POA: Diagnosis not present

## 2021-09-22 DIAGNOSIS — Z933 Colostomy status: Secondary | ICD-10-CM | POA: Diagnosis not present

## 2021-09-22 DIAGNOSIS — Z885 Allergy status to narcotic agent status: Secondary | ICD-10-CM | POA: Diagnosis not present

## 2021-09-22 LAB — CBC
HCT: 34.4 % — ABNORMAL LOW (ref 39.0–52.0)
Hemoglobin: 11.3 g/dL — ABNORMAL LOW (ref 13.0–17.0)
MCH: 30.1 pg (ref 26.0–34.0)
MCHC: 32.8 g/dL (ref 30.0–36.0)
MCV: 91.5 fL (ref 80.0–100.0)
Platelets: 399 10*3/uL (ref 150–400)
RBC: 3.76 MIL/uL — ABNORMAL LOW (ref 4.22–5.81)
RDW: 13.8 % (ref 11.5–15.5)
WBC: 8.3 10*3/uL (ref 4.0–10.5)
nRBC: 0 % (ref 0.0–0.2)

## 2021-09-22 LAB — COMPREHENSIVE METABOLIC PANEL
ALT: 12 U/L (ref 0–44)
AST: 20 U/L (ref 15–41)
Albumin: 3.2 g/dL — ABNORMAL LOW (ref 3.5–5.0)
Alkaline Phosphatase: 38 U/L (ref 38–126)
Anion gap: 5 (ref 5–15)
BUN: 16 mg/dL (ref 8–23)
CO2: 26 mmol/L (ref 22–32)
Calcium: 8 mg/dL — ABNORMAL LOW (ref 8.9–10.3)
Chloride: 106 mmol/L (ref 98–111)
Creatinine, Ser: 0.96 mg/dL (ref 0.61–1.24)
GFR, Estimated: >60 mL/min (ref >60)
Glucose, Bld: 100 mg/dL — ABNORMAL HIGH (ref 70–99)
Potassium: 3.8 mmol/L (ref 3.5–5.1)
Sodium: 137 mmol/L (ref 135–145)
Total Bilirubin: 0.7 mg/dL (ref 0.3–1.2)
Total Protein: 6.2 g/dL — ABNORMAL LOW (ref 6.5–8.1)

## 2021-09-22 LAB — ABO/RH: ABO/RH(D): O POS

## 2021-09-22 MED ORDER — DOCUSATE SODIUM 100 MG PO CAPS
100.0000 mg | ORAL_CAPSULE | Freq: Two times a day (BID) | ORAL | Status: DC
  Administered 2021-09-22 – 2021-09-23 (×3): 100 mg via ORAL
  Filled 2021-09-22 (×3): qty 1

## 2021-09-22 MED ORDER — POLYETHYLENE GLYCOL 3350 17 G PO PACK: 17.0000 g | PACK | Freq: Every day | ORAL | Status: DC | PRN

## 2021-09-22 NOTE — ED Notes (Signed)
Pt care taken, resting, no complaints at this time 

## 2021-09-22 NOTE — Consult Note (Signed)
Jon Wallace Jon Wallace 07/06/1946  [id number].    Requesting MD: Jon Belton, MD Chief Complaint/Reason for Consult: pSBO  HPI:  Jon Wallace is a 76 year old male with a past medical history of rectal cancer s/p APR in 2003 and diagnostic laparoscopy with colostomy revision in 2020 (by Dr. Ellison Wallace) who presented to [facility] ED abdominal pain and nausea, as well as decreased colostomy output.   Patient reports Friday night he began having a constant, severe "churning" pain that radiated across his lower abdomen from right to left with associated nausea and 5-6 episodes of emesis.  He reports that this feels similar to prior bowel obstructions he has had in the past.  He was most recently admitted to hospital for SBO on 04/2021 which resolved with conservative therapy.  He reports obstruction like symptoms that he intermittently gets at home, at least 2 episodes since September, that will resolve with bowel rest, a hot bath and miralax at home.  He tried these remedies on this occasion as well but his symptoms did not improve. Work-up in the emergency department included a CT scan of the abdomen and pelvis which revealed signs of small bowel obstruction with a transition zone in the pelvis w/ similar hyperenhancing bowel from CT on 04/12/2021.  Since admission patient has received 30 g Lasix twice daily and 5 mg Dulcolax p.o. daily.  He reports all of his symptoms resolved.  Denies any abdominal pain, nausea, vomiting or abdominal distention.  Last episode of emesis was Sunday at 7 AM.  He reports large amounts of liquid ostomy output this morning having to empty x 3.  Patient reports that he follows with Dr. Lyndel Wallace GI as an outpatient. Reports he has had a scope in the last 6 months that did not show any narrowing/stenosis. I cannot see any of these notes/reports. He also had an MRE A/P in December but the study was limited 2/2 motion artifact.   Patient states he is a former smoker.  Denies alcohol or other  drug use.  Denies the use of blood thinning medications.  He lives at home with wife. Retired.   ROS: Review of Systems  Constitutional:  Negative for chills and fever.  Respiratory:  Negative for cough and shortness of breath.   Cardiovascular:  Negative for chest pain and leg swelling.  Gastrointestinal:  Positive for abdominal pain, nausea and vomiting.  All other systems reviewed and are negative.  Family History  Problem Relation Age of Onset   COPD Father    Throat cancer Father    Thyroid cancer Sister        and jaw cancer   Colon cancer Paternal Uncle    Stomach cancer Cousin    Pancreatic cancer Neg Hx     Past Medical History:  Diagnosis Date   Abdominal hernia    Cancer ([place])    Colon 2003 and Lung 2011    Past Surgical History:  Procedure Laterality Date   COLON SURGERY     COLOSTOMY     KNEE ARTHROSCOPY W/ MENISCAL REPAIR Left     Social History:  reports that he has quit smoking. He has never used smokeless tobacco. He reports that he does not currently use alcohol. He reports that he does not currently use drugs.  Allergies:  Allergies  Allergen Reactions   Morphine And Related Nausea And Vomiting    (Not in a hospital admission)    Physical Exam: Blood pressure 133/81, pulse 75, temperature 98.5  F (36.9 C), temperature source Oral, resp. rate 16, SpO2 99 %. General: pleasant, WD/WN male who is laying in bed in NAD HEENT: head is normocephalic, atraumatic.  Sclera are noninjected.  PERRL.  Ears and nose without any masses or lesions.  Mouth is pink and moist. Dentition fair Heart: regular, rate, and rhythm.  Normal s1,s2. No obvious murmurs, gallops, or rubs noted.  Palpable pedal pulses bilaterally  Lungs: CTAB, no wheezes, rhonchi, or rales noted.  Respiratory effort nonlabored Abd: Possibly some very mild lower abdominal distension but very soft. Very mild epigastric tenderness - otherwise NT. No peritonitis. +BS. No masses or organomegaly.  Prior surgical scars are well healed. Ostomy bag with liquid stool present. Stoma partially visualized and appears pink and viable.  MS: no BUE/BLE edema, calves soft and nontender Skin: warm and dry with no masses, lesions, or rashes Psych: A&Ox4 with an appropriate affect Neuro: cranial nerves grossly intact, equal strength in BUE/BLE bilaterally, normal speech, thought process intact, moves all extremities, gait not assessed   Results for orders placed or performed during the hospital encounter of 09/21/21 (from the past 48 hour(s))  Resp Panel by RT-PCR (Flu A&B, Covid) Nasopharyngeal Swab     Status: None   Collection Time: 09/21/21  9:56 AM   Specimen: Nasopharyngeal Swab; Nasopharyngeal(NP) swabs in vial transport medium  Result Value Ref Range   SARS Coronavirus 2 by RT PCR NEGATIVE NEGATIVE    Comment: (NOTE) SARS-CoV-2 target nucleic acids are NOT DETECTED.  The SARS-CoV-2 RNA is generally detectable in upper respiratory specimens during the acute phase of infection. The lowest concentration of SARS-CoV-2 viral copies this assay can detect is 138 copies/mL. A negative result does not preclude SARS-Cov-2 infection and should not be used as the sole basis for treatment or other patient management decisions. A negative result may occur with  improper specimen collection/handling, submission of specimen other than nasopharyngeal swab, presence of viral mutation(s) within the areas targeted by this assay, and inadequate number of viral copies(<138 copies/mL). A negative result must be combined with clinical observations, patient history, and epidemiological information. The expected result is Negative.  Fact Sheet for Patients:  EntrepreneurPulse.com.au  Fact Sheet for Healthcare Providers:  IncredibleEmployment.be  This test is no t yet approved or cleared by the [place] FDA and  has been authorized for detection and/or diagnosis of  SARS-CoV-2 by FDA under an Emergency Use Authorization (EUA). This EUA will remain  in effect (meaning this test can be used) for the duration of the COVID-19 declaration under Section 564(b)(1) of the Act, 21 U.S.C.section 360bbb-3(b)(1), unless the authorization is terminated  or revoked sooner.       Influenza A by PCR NEGATIVE NEGATIVE   Influenza B by PCR NEGATIVE NEGATIVE    Comment: (NOTE) The Xpert Xpress SARS-CoV-2/FLU/RSV plus assay is intended as an aid in the diagnosis of influenza from Nasopharyngeal swab specimens and should not be used as a sole basis for treatment. Nasal washings and aspirates are unacceptable for Xpert Xpress SARS-CoV-2/FLU/RSV testing.  Fact Sheet for Patients: EntrepreneurPulse.com.au  Fact Sheet for Healthcare Providers: IncredibleEmployment.be  This test is not yet approved or cleared by the [place] FDA and has been authorized for detection and/or diagnosis of SARS-CoV-2 by FDA under an Emergency Use Authorization (EUA). This EUA will remain in effect (meaning this test can be used) for the duration of the COVID-19 declaration under Section 564(b)(1) of the Act, 21 U.S.C. section 360bbb-3(b)(1), unless the authorization is terminated  or revoked.  Performed at [facility], [place] [address], [place], [place] [address]   Comprehensive metabolic panel     Status: Abnormal   Collection Time: 09/21/21 10:04 AM  Result Value Ref Range   Sodium 135 135 - 145 mmol/L   Potassium 4.1 3.5 - 5.1 mmol/L   Chloride 102 98 - 111 mmol/L   CO2 26 22 - 32 mmol/L   Glucose, Bld 118 (H) 70 - 99 mg/dL    Comment: Glucose reference range applies only to samples taken after fasting for at least 8 hours.   BUN 22 8 - 23 mg/dL   Creatinine, Ser 1.06 0.61 - 1.24 mg/dL   Calcium 8.6 (L) 8.9 - 10.3 mg/dL   Total Protein 7.3 6.5 - 8.1 g/dL   Albumin 3.9 3.5 - 5.0 g/dL   AST 21 15 - 41 U/L   ALT 12 0 -  44 U/L   Alkaline Phosphatase 40 38 - 126 U/L   Total Bilirubin 0.6 0.3 - 1.2 mg/dL   GFR, Estimated >60 >60 mL/min    Comment: (NOTE) Calculated using the CKD-EPI Creatinine Equation (2021)    Anion gap 7 5 - 15    Comment: Performed at [facility], [place] [address] [place], [place] [address]  Lipase, blood     Status: None   Collection Time: 09/21/21 10:04 AM  Result Value Ref Range   Lipase 30 11 - 51 U/L    Comment: Performed at [facility], [place] [address], [place], [place] [address]  CBC with Differential     Status: Abnormal   Collection Time: 09/21/21 10:04 AM  Result Value Ref Range   WBC 9.6 4.0 - 10.5 K/uL   RBC 4.22 4.22 - 5.81 MIL/uL   Hemoglobin 12.6 (L) 13.0 - 17.0 g/dL   HCT 38.1 (L) 39.0 - 52.0 %   MCV 90.3 80.0 - 100.0 fL   MCH 29.9 26.0 - 34.0 pg   MCHC 33.1 30.0 - 36.0 g/dL   RDW 13.8 11.5 - 15.5 %   Platelets 475 (H) 150 - 400 K/uL   nRBC 0.0 0.0 - 0.2 %   Neutrophils Relative % 80 %   Neutro Abs 7.7 1.7 - 7.7 K/uL   Lymphocytes Relative 10 %   Lymphs Abs 1.0 0.7 - 4.0 K/uL   Monocytes Relative 9 %   Monocytes Absolute 0.9 0.1 - 1.0 K/uL   Eosinophils Relative 0 %   Eosinophils Absolute 0.0 0.0 - 0.5 K/uL   Basophils Relative 1 %   Basophils Absolute 0.1 0.0 - 0.1 K/uL   Immature Granulocytes 0 %   Abs Immature Granulocytes 0.04 0.00 - 0.07 K/uL    Comment: Performed at [facility], [place] [address], [place], [place] [address]  Type and screen [place]     Status: None   Collection Time: 09/21/21 10:04 AM  Result Value Ref Range   ABO/RH(D) O POS    Antibody Screen NEG    Sample Expiration      09/24/2021,2359 Performed at [place] [address], [place], [place] [address]   Comprehensive metabolic panel     Status: Abnormal   Collection Time: 09/22/21  4:15 AM  Result Value Ref Range   Sodium 137 135 - 145 mmol/L   Potassium 3.8 3.5 - 5.1  mmol/L   Chloride 106 98 - 111 mmol/L   CO2 26 22 - 32  mmol/L   Glucose, Bld 100 (H) 70 - 99 mg/dL    Comment: Glucose reference range applies only to samples taken after fasting for at least 8 hours.   BUN 16 8 - 23 mg/dL   Creatinine, Ser 0.96 0.61 - 1.24 mg/dL   Calcium 8.0 (L) 8.9 - 10.3 mg/dL   Total Protein 6.2 (L) 6.5 - 8.1 g/dL   Albumin 3.2 (L) 3.5 - 5.0 g/dL   AST 20 15 - 41 U/L   ALT 12 0 - 44 U/L   Alkaline Phosphatase 38 38 - 126 U/L   Total Bilirubin 0.7 0.3 - 1.2 mg/dL   GFR, Estimated >60 >60 mL/min    Comment: (NOTE) Calculated using the CKD-EPI Creatinine Equation (2021)    Anion gap 5 5 - 15    Comment: Performed at [facility], [place] [address] [place], [place] [address]  CBC     Status: Abnormal   Collection Time: 09/22/21  4:15 AM  Result Value Ref Range   WBC 8.3 4.0 - 10.5 K/uL   RBC 3.76 (L) 4.22 - 5.81 MIL/uL   Hemoglobin 11.3 (L) 13.0 - 17.0 g/dL   HCT 34.4 (L) 39.0 - 52.0 %   MCV 91.5 80.0 - 100.0 fL   MCH 30.1 26.0 - 34.0 pg   MCHC 32.8 30.0 - 36.0 g/dL   RDW 13.8 11.5 - 15.5 %   Platelets 399 150 - 400 K/uL   nRBC 0.0 0.0 - 0.2 %    Comment: Performed at [facility], [place], [place] [address] [place], [place] [address]   CT ABDOMEN PELVIS W CONTRAST  Result Date: 09/21/2021 CLINICAL DATA:  History of colon cancer. Status post colostomy. No stool for 3 days. Vomiting and abdominal pain. EXAM: CT ABDOMEN AND PELVIS WITH CONTRAST TECHNIQUE: Multidetector CT imaging of the abdomen and pelvis was performed using the standard protocol following bolus administration of intravenous contrast. RADIATION DOSE REDUCTION: This exam was performed according to the departmental dose-optimization program which includes automated exposure control, adjustment of the mA and/or kV according to patient size and/or use of iterative reconstruction technique. CONTRAST:  120mL OMNIPAQUE IOHEXOL 300 MG/ML  SOLN COMPARISON:  04/12/2021 FINDINGS:  Lower chest: Surgical scarring noted posterior right lower lobe. Hepatobiliary: No suspicious focal abnormality within the liver parenchyma. Small area of low attenuation in the anterior liver, adjacent to the falciform ligament, is in a characteristic location for focal fatty deposition. There is no evidence for gallstones, gallbladder wall thickening, or pericholecystic fluid. No intrahepatic or extrahepatic biliary dilation. Pancreas: No focal mass lesion. No dilatation of the main duct. No intraparenchymal cyst. No peripancreatic edema. Spleen: No splenomegaly. No focal mass lesion. Adrenals/Urinary Tract: No adrenal nodule or mass. Small cyst noted right kidney. Left kidney unremarkable. No evidence for hydroureter. The urinary bladder appears normal for the degree of distention. Stomach/Bowel: Stomach is unremarkable. No gastric wall thickening. No evidence of outlet obstruction. Duodenum is normally positioned as is the ligament of Treitz. Small bowel loops in the mid abdomen are mildly distended up to 3.4 cm diameter. There is some transmural hyperenhancement of small bowel in the pelvis. A loop with fecalized contents is identified in the anterior pelvis (image 57/2 that abruptly tapers in small bowel distal to this point contains only gas (well demonstrated on coronal image 40/4). This is in the same region as the hyperenhancing bowel seen on CT scan from 04/12/2021 and is just deep to the anterior peritoneum. Distal and  terminal ileum is decompressed. Colon is diffusely decompressed with left abdominal sigmoid end colostomy noted. Surgical changes in the pelvis compatible with APR. Vascular/Lymphatic: No abdominal aortic aneurysm. There is no gastrohepatic or hepatoduodenal ligament lymphadenopathy. No retroperitoneal or mesenteric lymphadenopathy. No pelvic sidewall lymphadenopathy. Reproductive: Unremarkable. Other: Trace intraperitoneal free fluid evident. Musculoskeletal: No worrisome lytic or  sclerotic osseous abnormality. IMPRESSION: 1. Small bowel loops in the mid abdomen are mildly distended up to 3.4 cm diameter. There is some transmural hyperenhancement of small bowel in the pelvis. A small bowel loop with fecalized contents is identified in the anterior pelvis that abruptly tapers into a nondilated loop the contains only gas but no enteric contents. This is in the same region as the hyperenhancing bowel seen on CT scan from 04/12/2021 and is just deep to the anterior peritoneal lining. Imaging features are compatible with a small bowel obstruction. Distal ileum beyond this point is decompressed. 2. Trace intraperitoneal free fluid. 3. 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 Electronically Signed   By: Misty Stanley M.D.   On: 09/21/2021 11:32    Anti-infectives (From admission, onward)    None       Assessment/Plan SBO 76 year old male with a past medical history of rectal cancer status post APR in 2003 and diagnostic laparoscopy with ostomy revision 04/20/2019 at [facility] who presented with abdominal pain, decreased colostomy output, nausea, and vomiting.  CT scan of the abdomen and pelvis revealed small bowel obstruction. His vital signs are within normal limits and he does not have a leukocytosis. Symptoms have now resolved and he is having gas and output via colostomy. I do not see any urgent or emergent indication for abdominal surgery. Discussed case with attending. Ok to slowly advance his diet. If he was to have any recurrence of his symptoms, would recommend making npo and obtaining xray. Encourage him to continue a bowel regimen at home including a stool softener and daily MiraLAX. He is already established with a GI and [facility] gen surg as outpatient.   FEN - Okay to start clear liquids ADAT to soft VTE - SCDs, okay for chemical VTE prophylaxis from a surgical perspective ID -none  Moderate Medical Decision Making  Jillyn Ledger,  [facility] 09/22/2021, 1:32 PM Please see Amion for pager number during day hours 7:00am-4:30pm or 7:00am -11:30am on weekends

## 2021-09-22 NOTE — Assessment & Plan Note (Addendum)
Resolved at this time.  Patient was treated conservatively during hospitalization.  General surgery followed the patient.  Tolerated oral diet.  Patient follows up with Dr. Lyndel Safe as outpatient.  Spoke with general surgery prior to discharge.

## 2021-09-22 NOTE — Assessment & Plan Note (Addendum)
Status post resection in the past.  Has colostomy in place.

## 2021-09-22 NOTE — Hospital Course (Signed)
Jon Wallace is a 76 y.o. male with past medical history of colon cancer status post colectomy with colostomy, history of recurrent small bowel obstruction presented to hospital with abdominal pain, nausea and vomiting with decreased output at the colostomy site.  Patient takes MiraLAX daily at home but has not been helping.  ET scan done in the ED showed small bowel loops in the midabdomen.  WBC was 9.6 with creatinine of 1.06.  Patient was then considered for admission to hospital for small bowel obstruction.

## 2021-09-22 NOTE — Progress Notes (Addendum)
PROGRESS NOTE    Jon Wallace  DGL:[id number] DOB: 1946/04/06 DOA: 09/21/2021 PCP: Vernie Shanks, MD    Brief Narrative:  Jon Wallace is a 76 y.o. male with past medical history of colon cancer status post colectomy with colostomy, history of recurrent small bowel obstruction presented to hospital with abdominal pain, nausea and vomiting with decreased output at the colostomy site.  Patient takes MiraLAX daily at home but has not been helping.  ET scan done in the ED showed small bowel loops in the midabdomen.  WBC was 9.6 with creatinine of 1.06.  Patient was then considered for admission to hospital for small bowel obstruction.     Assessment and Plan: * SBO (small bowel obstruction) ([place])- (present on admission) General surgery is recommended NG tube placement but patient has refused.  Continue antiemetics bowel regimen.  NPO.  IV fluids.  Patient states that he did have some bowel movement in his colostomy bag.  We will follow surgical recommendation, patient might tolerate clears.  History of rectal cancer- (present on admission) Status post resection in the past.  Has colostomy in place.    DVT prophylaxis: SCDs Start: 09/21/21 1255   Code Status:     Code Status: Full Code  Disposition: Home  Status is: Observation  The patient will require care spanning > 2 midnights and should be moved to inpatient because: Bowel obstruction, IV fluids, conservative treatment    Family Communication: Spoke with the patient at bedside. I spoke with the patient's wife on the phone and updated her about the clinical condition of the patient.   Consultants:  General surgery  Procedures:  None  Antimicrobials:  None  Anti-infectives (From admission, onward)    None        Subjective: Today, patient was seen and examined at bedside.  Patient states had no nausea, vomiting bowel distention has improved.  Has had few loose stools in his colostomy bag  today.  Objective: Vitals:   09/22/21 0900 09/22/21 1030 09/22/21 1100 09/22/21 1130  BP: 117/78 130/86 126/81 133/81  Pulse: 83 72 74 75  Resp: 15 15 16 16   Temp:      TempSrc:      SpO2: 95% 98% 100% 99%   No intake or output data in the 24 hours ending 09/22/21 1232 There were no vitals filed for this visit.  Physical Examination:  General:  Average built, not in obvious distress HENT:   No scleral pallor or icterus noted. Oral mucosa is moist.  Chest:  Clear breath sounds.  Diminished breath sounds bilaterally. No crackles or wheezes.  CVS: S1 &S2 heard. No murmur.  Regular rate and rhythm. Abdomen: Soft, nontender, nondistended.  Bowel sounds are heard.  Colostomy bag in place with output Extremities: No cyanosis, clubbing or edema.  Peripheral pulses are palpable. Psych: Alert, awake and oriented, normal mood CNS:  No cranial nerve deficits.  Power equal in all extremities.   Skin: Warm and dry.  No rashes noted.  Data Reviewed:   CBC: Recent Labs  Lab 09/21/21 1004 09/22/21 0415  WBC 9.6 8.3  NEUTROABS 7.7  --   HGB 12.6* 11.3*  HCT 38.1* 34.4*  MCV 90.3 91.5  PLT 475* 518    Basic Metabolic Panel: Recent Labs  Lab 09/21/21 1004 09/22/21 0415  NA 135 137  K 4.1 3.8  CL 102 106  CO2 26 26  GLUCOSE 118* 100*  BUN 22 16  CREATININE 1.06 0.96  CALCIUM  8.6* 8.0*    Liver Function Tests: Recent Labs  Lab 09/21/21 1004 09/22/21 0415  AST 21 20  ALT 12 12  ALKPHOS 40 38  BILITOT 0.6 0.7  PROT 7.3 6.2*  ALBUMIN 3.9 3.2*     Radiology Studies: CT ABDOMEN PELVIS W CONTRAST  Result Date: 09/21/2021 CLINICAL DATA:  History of colon cancer. Status post colostomy. No stool for 3 days. Vomiting and abdominal pain. EXAM: CT ABDOMEN AND PELVIS WITH CONTRAST TECHNIQUE: Multidetector CT imaging of the abdomen and pelvis was performed using the standard protocol following bolus administration of intravenous contrast. RADIATION DOSE REDUCTION: This exam was  performed according to the departmental dose-optimization program which includes automated exposure control, adjustment of the mA and/or kV according to patient size and/or use of iterative reconstruction technique. CONTRAST:  147mL OMNIPAQUE IOHEXOL 300 MG/ML  SOLN COMPARISON:  04/12/2021 FINDINGS: Lower chest: Surgical scarring noted posterior right lower lobe. Hepatobiliary: No suspicious focal abnormality within the liver parenchyma. Small area of low attenuation in the anterior liver, adjacent to the falciform ligament, is in a characteristic location for focal fatty deposition. There is no evidence for gallstones, gallbladder wall thickening, or pericholecystic fluid. No intrahepatic or extrahepatic biliary dilation. Pancreas: No focal mass lesion. No dilatation of the main duct. No intraparenchymal cyst. No peripancreatic edema. Spleen: No splenomegaly. No focal mass lesion. Adrenals/Urinary Tract: No adrenal nodule or mass. Small cyst noted right kidney. Left kidney unremarkable. No evidence for hydroureter. The urinary bladder appears normal for the degree of distention. Stomach/Bowel: Stomach is unremarkable. No gastric wall thickening. No evidence of outlet obstruction. Duodenum is normally positioned as is the ligament of Treitz. Small bowel loops in the mid abdomen are mildly distended up to 3.4 cm diameter. There is some transmural hyperenhancement of small bowel in the pelvis. A loop with fecalized contents is identified in the anterior pelvis (image 57/2 that abruptly tapers in small bowel distal to this point contains only gas (well demonstrated on coronal image 40/4). This is in the same region as the hyperenhancing bowel seen on CT scan from 04/12/2021 and is just deep to the anterior peritoneum. Distal and terminal ileum is decompressed. Colon is diffusely decompressed with left abdominal sigmoid end colostomy noted. Surgical changes in the pelvis compatible with APR. Vascular/Lymphatic: No  abdominal aortic aneurysm. There is no gastrohepatic or hepatoduodenal ligament lymphadenopathy. No retroperitoneal or mesenteric lymphadenopathy. No pelvic sidewall lymphadenopathy. Reproductive: Unremarkable. Other: Trace intraperitoneal free fluid evident. Musculoskeletal: No worrisome lytic or sclerotic osseous abnormality. IMPRESSION: 1. Small bowel loops in the mid abdomen are mildly distended up to 3.4 cm diameter. There is some transmural hyperenhancement of small bowel in the pelvis. A small bowel loop with fecalized contents is identified in the anterior pelvis that abruptly tapers into a nondilated loop the contains only gas but no enteric contents. This is in the same region as the hyperenhancing bowel seen on CT scan from 04/12/2021 and is just deep to the anterior peritoneal lining. Imaging features are compatible with a small bowel obstruction. Distal ileum beyond this point is decompressed. 2. Trace intraperitoneal free fluid. 3. 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 Electronically Signed   By: Misty Stanley M.D.   On: 09/21/2021 11:32      LOS: 0 days    Flora Lipps, MD [facility] 09/22/2021, 12:32 PM

## 2021-09-23 DIAGNOSIS — K56609 Unspecified intestinal obstruction, unspecified as to partial versus complete obstruction: Secondary | ICD-10-CM | POA: Diagnosis not present

## 2021-09-23 DIAGNOSIS — K56600 Partial intestinal obstruction, unspecified as to cause: Secondary | ICD-10-CM | POA: Diagnosis not present

## 2021-09-23 DIAGNOSIS — Z85048 Personal history of other malignant neoplasm of rectum, rectosigmoid junction, and anus: Secondary | ICD-10-CM | POA: Diagnosis not present

## 2021-09-23 LAB — COMPREHENSIVE METABOLIC PANEL
ALT: 13 U/L (ref 0–44)
AST: 18 U/L (ref 15–41)
Albumin: 3.2 g/dL — ABNORMAL LOW (ref 3.5–5.0)
Alkaline Phosphatase: 37 U/L — ABNORMAL LOW (ref 38–126)
Anion gap: 5 (ref 5–15)
BUN: 14 mg/dL (ref 8–23)
CO2: 24 mmol/L (ref 22–32)
Calcium: 8 mg/dL — ABNORMAL LOW (ref 8.9–10.3)
Chloride: 108 mmol/L (ref 98–111)
Creatinine, Ser: 0.98 mg/dL (ref 0.61–1.24)
GFR, Estimated: >60 mL/min (ref >60)
Glucose, Bld: 84 mg/dL (ref 70–99)
Potassium: 3.6 mmol/L (ref 3.5–5.1)
Sodium: 137 mmol/L (ref 135–145)
Total Bilirubin: 0.5 mg/dL (ref 0.3–1.2)
Total Protein: 6.1 g/dL — ABNORMAL LOW (ref 6.5–8.1)

## 2021-09-23 LAB — CBC
HCT: 33.9 % — ABNORMAL LOW (ref 39.0–52.0)
Hemoglobin: 10.9 g/dL — ABNORMAL LOW (ref 13.0–17.0)
MCH: 29.5 pg (ref 26.0–34.0)
MCHC: 32.2 g/dL (ref 30.0–36.0)
MCV: 91.6 fL (ref 80.0–100.0)
Platelets: 416 10*3/uL — ABNORMAL HIGH (ref 150–400)
RBC: 3.7 MIL/uL — ABNORMAL LOW (ref 4.22–5.81)
RDW: 13.4 % (ref 11.5–15.5)
WBC: 7.1 10*3/uL (ref 4.0–10.5)
nRBC: 0 % (ref 0.0–0.2)

## 2021-09-23 LAB — PHOSPHORUS: Phosphorus: 3.3 mg/dL (ref 2.5–4.6)

## 2021-09-23 LAB — MAGNESIUM: Magnesium: 2.1 mg/dL (ref 1.7–2.4)

## 2021-09-23 MED ORDER — ONDANSETRON HCL 4 MG PO TABS: 4.0000 mg | ORAL_TABLET | Freq: Every day | ORAL | 1 refills | Status: DC | PRN

## 2021-09-23 MED ORDER — POLYETHYLENE GLYCOL 3350 17 G PO PACK: 17.0000 g | PACK | Freq: Every day | ORAL | 0 refills | Status: DC | PRN

## 2021-09-23 NOTE — Progress Notes (Signed)
Transition of Care  [facility]) Screening Note  Patient Details  Name: Jon Wallace Date of Birth: 07-Oct-1945  Transition of Care [facility]) CM/SW Contact:    Sherie Don, LCSW Phone Number: 09/23/2021, 11:04 AM  Transition of Care Department [facility]) has reviewed patient and no TOC needs have been identified at this time. We will continue to monitor patient advancement through interdisciplinary progression rounds. If new patient transition needs arise, please place a TOC consult.

## 2021-09-23 NOTE — Discharge Summary (Signed)
Physician Discharge Summary   Patient: Jon Wallace MRN: [id number] DOB: [birth year]  Admit date:     09/21/2021  Discharge date: 09/23/21  Discharge Physician: Jon Wallace Jon Wallace   PCP: Jon Shanks, MD   Recommendations at discharge:   Follow-up with your primary care physician in 1 week Follow-up with Dr. Lyndel Wallace as scheduled by the clinic  Discharge Diagnoses: Active Problems:   History of rectal cancer   Partial small bowel obstruction (HCC)  Principal Problem (Resolved):   SBO (small bowel obstruction) Jon Wallace)   Hospital Course: Jon Wallace is a 76 y.o. male with past medical history of colon cancer status post colectomy with colostomy, history of recurrent small bowel obstruction presented to hospital with abdominal pain, nausea and vomiting with decreased output at the colostomy site.  Patient takes MiraLAX daily at home but has not been helping.  ET scan done in the ED showed small bowel loops in the midabdomen.  WBC was 9.6 with creatinine of 1.06.  Patient was then considered for admission to hospital for small bowel obstruction.  Assessment and Plan: * SBO (small bowel obstruction) (HCC)-resolved as of 09/23/2021, (present on admission) Resolved at this time.  Patient was treated conservatively during hospitalization.  General surgery followed the patient.  Tolerated oral diet.  Patient follows up with Dr. Lyndel Wallace as outpatient.  Spoke with general surgery prior to discharge.  History of rectal cancer- (present on admission) Status post resection in the past.  Has colostomy in place.   Consultants: General surgery Procedures performed: None Disposition: Home Diet recommendation:  Discharge Diet Orders (From admission, onward)     Start     Ordered   09/23/21 0000  Diet - low sodium heart healthy       Comments: Soft and advance as tolerated   09/23/21 0854           Cardiac diet  DISCHARGE MEDICATION: Allergies as of 09/23/2021       Reactions   Morphine  And Related Nausea And Vomiting        Medication List     TAKE these medications    ondansetron 4 MG tablet Commonly known as: Zofran Take 1 tablet (4 mg total) by mouth daily as needed for nausea or vomiting.   polyethylene glycol 17 g packet Commonly known as: MIRALAX / GLYCOLAX Take 17 g by mouth daily as needed for mild constipation or moderate constipation.         Subjective Patient was seen and examined at bedside.  Patient tolerated oral diet.  No nausea vomiting or abdominal pain.  Has had bowel movements.  Discharge Exam: Filed Weights   09/22/21 1736  Weight: 65.4 kg   General:  Average built, not in obvious distress HENT:   No scleral pallor or icterus noted. Oral mucosa is moist.  Chest:  Clear breath sounds.  Diminished breath sounds bilaterally. No crackles or wheezes.  CVS: S1 &S2 heard. No murmur.  Regular rate and rhythm. Abdomen: Soft, nontender, nondistended.  Bowel sounds are heard.  Colostomy in place Extremities: No cyanosis, clubbing or edema.  Peripheral pulses are palpable. Psych: Alert, awake and oriented, normal mood CNS:  No cranial nerve deficits.  Power equal in all extremities.   Skin: Warm and dry.  No rashes noted.   Condition at discharge: good  The results of significant diagnostics from this hospitalization (including imaging, microbiology, ancillary and laboratory) are listed below for reference.   Imaging Studies: CT ABDOMEN PELVIS W  CONTRAST  Result Date: 09/21/2021 CLINICAL DATA:  History of colon cancer. Status post colostomy. No stool for 3 days. Vomiting and abdominal pain. EXAM: CT ABDOMEN AND PELVIS WITH CONTRAST TECHNIQUE: Multidetector CT imaging of the abdomen and pelvis was performed using the standard protocol following bolus administration of intravenous contrast. RADIATION DOSE REDUCTION: This exam was performed according to the departmental dose-optimization program which includes automated exposure control,  adjustment of the mA and/or kV according to patient size and/or use of iterative reconstruction technique. CONTRAST:  166mL OMNIPAQUE IOHEXOL 300 MG/ML  SOLN COMPARISON:  04/12/2021 FINDINGS: Lower chest: Surgical scarring noted posterior right lower lobe. Hepatobiliary: No suspicious focal abnormality within the liver parenchyma. Small area of low attenuation in the anterior liver, adjacent to the falciform ligament, is in a characteristic location for focal fatty deposition. There is no evidence for gallstones, gallbladder wall thickening, or pericholecystic fluid. No intrahepatic or extrahepatic biliary dilation. Pancreas: No focal mass lesion. No dilatation of the main duct. No intraparenchymal cyst. No peripancreatic edema. Spleen: No splenomegaly. No focal mass lesion. Adrenals/Urinary Tract: No adrenal nodule or mass. Small cyst noted right kidney. Left kidney unremarkable. No evidence for hydroureter. The urinary bladder appears normal for the degree of distention. Stomach/Bowel: Stomach is unremarkable. No gastric wall thickening. No evidence of outlet obstruction. Duodenum is normally positioned as is the ligament of Treitz. Small bowel loops in the mid abdomen are mildly distended up to 3.4 cm diameter. There is some transmural hyperenhancement of small bowel in the pelvis. A loop with fecalized contents is identified in the anterior pelvis (image 57/2 that abruptly tapers in small bowel distal to this point contains only gas (well demonstrated on coronal image 40/4). This is in the same region as the hyperenhancing bowel seen on CT scan from 04/12/2021 and is just deep to the anterior peritoneum. Distal and terminal ileum is decompressed. Colon is diffusely decompressed with left abdominal sigmoid end colostomy noted. Surgical changes in the pelvis compatible with APR. Vascular/Lymphatic: No abdominal aortic aneurysm. There is no gastrohepatic or hepatoduodenal ligament lymphadenopathy. No  retroperitoneal or mesenteric lymphadenopathy. No pelvic sidewall lymphadenopathy. Reproductive: Unremarkable. Other: Trace intraperitoneal free fluid evident. Musculoskeletal: No worrisome lytic or sclerotic osseous abnormality. IMPRESSION: 1. Small bowel loops in the mid abdomen are mildly distended up to 3.4 cm diameter. There is some transmural hyperenhancement of small bowel in the pelvis. A small bowel loop with fecalized contents is identified in the anterior pelvis that abruptly tapers into a nondilated loop the contains only gas but no enteric contents. This is in the same region as the hyperenhancing bowel seen on CT scan from 04/12/2021 and is just deep to the anterior peritoneal lining. Imaging features are compatible with a small bowel obstruction. Distal ileum beyond this point is decompressed. 2. Trace intraperitoneal free fluid. 3. 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 Electronically Signed   By: Misty Stanley M.D.   On: 09/21/2021 11:32    Microbiology: Results for orders placed or performed during the hospital encounter of 09/21/21  Resp Panel by RT-PCR (Flu A&B, Covid) Nasopharyngeal Swab     Status: None   Collection Time: 09/21/21  9:56 AM   Specimen: Nasopharyngeal Swab; Nasopharyngeal(NP) swabs in vial transport medium  Result Value Ref Range Status   SARS Coronavirus 2 by RT PCR NEGATIVE NEGATIVE Final    Comment: (NOTE) SARS-CoV-2 target nucleic acids are NOT DETECTED.  The SARS-CoV-2 RNA is generally detectable in upper respiratory specimens during the  acute phase of infection. The lowest concentration of SARS-CoV-2 viral copies this assay can detect is 138 copies/mL. A negative result does not preclude SARS-Cov-2 infection and should not be used as the sole basis for treatment or other patient management decisions. A negative result may occur with  improper specimen collection/handling, submission of specimen other than nasopharyngeal swab, presence  of viral mutation(s) within the areas targeted by this assay, and inadequate number of viral copies(<138 copies/mL). A negative result must be combined with clinical observations, patient history, and epidemiological information. The expected result is Negative.  Fact Sheet for Patients:  EntrepreneurPulse.com.au  Fact Sheet for Healthcare Providers:  IncredibleEmployment.be  This test is no t yet approved or cleared by the [place] FDA and  has been authorized for detection and/or diagnosis of SARS-CoV-2 by FDA under an Emergency Use Authorization (EUA). This EUA will remain  in effect (meaning this test can be used) for the duration of the COVID-19 declaration under Section 564(b)(1) of the Act, 21 U.S.C.section 360bbb-3(b)(1), unless the authorization is terminated  or revoked sooner.       Influenza A by PCR NEGATIVE NEGATIVE Final   Influenza B by PCR NEGATIVE NEGATIVE Final    Comment: (NOTE) The Xpert Xpress SARS-CoV-2/FLU/RSV plus assay is intended as an aid in the diagnosis of influenza from Nasopharyngeal swab specimens and should not be used as a sole basis for treatment. Nasal washings and aspirates are unacceptable for Xpert Xpress SARS-CoV-2/FLU/RSV testing.  Fact Sheet for Patients: EntrepreneurPulse.com.au  Fact Sheet for Healthcare Providers: IncredibleEmployment.be  This test is not yet approved or cleared by the [place] FDA and has been authorized for detection and/or diagnosis of SARS-CoV-2 by FDA under an Emergency Use Authorization (EUA). This EUA will remain in effect (meaning this test can be used) for the duration of the COVID-19 declaration under Section 564(b)(1) of the Act, 21 U.S.C. section 360bbb-3(b)(1), unless the authorization is terminated or revoked.  Performed at [facility], [place] [address] [place], [place] [address]      Labs: CBC: Recent Labs  Lab 09/21/21 1004 09/22/21 0415 09/23/21 0419  WBC 9.6 8.3 7.1  NEUTROABS 7.7  --   --   HGB 12.6* 11.3* 10.9*  HCT 38.1* 34.4* 33.9*  MCV 90.3 91.5 91.6  PLT 475* 399 570*   Basic Metabolic Panel: Recent Labs  Lab 09/21/21 1004 09/22/21 0415 09/23/21 0419  NA 135 137 137  K 4.1 3.8 3.6  CL 102 106 108  CO2 26 26 24   GLUCOSE 118* 100* 84  BUN 22 16 14   CREATININE 1.06 0.96 0.98  CALCIUM 8.6* 8.0* 8.0*  MG  --   --  2.1  PHOS  --   --  3.3   Liver Function Tests: Recent Labs  Lab 09/21/21 1004 09/22/21 0415 09/23/21 0419  AST 21 20 18   ALT 12 12 13   ALKPHOS 40 38 37*  BILITOT 0.6 0.7 0.5  PROT 7.3 6.2* 6.1*  ALBUMIN 3.9 3.2* 3.2*   CBG: No results for input(s): GLUCAP in the last 168 hours.  Discharge time spent: greater than 30 minutes.  Signed: Flora Lipps, MD [facility] 09/23/2021

## 2021-09-23 NOTE — Progress Notes (Signed)
Subjective: CC: Doing well. Tolerating soft diet without abdominal pain, distension, n/v. Having ostomy output. Had to empty ostomy bag one time after I saw him yesterday (was ~1/2 full). Wants to go home.   Objective: Vital signs in last 24 hours: Temp:  [97.8 F (36.6 C)-98.5 F (36.9 C)] 97.8 F (36.6 C) (02/21 0649) Pulse Rate:  [67-75] 67 (02/21 0649) Resp:  [15-18] 18 (02/21 0649) BP: (114-133)/(71-86) 123/85 (02/21 0649) SpO2:  [97 %-100 %] 97 % (02/21 0649) Weight:  [65.4 kg] 65.4 kg (02/20 1736) Last BM Date : 09/22/21  Intake/Output from previous day: 02/20 0701 - 02/21 0700 In: 3679.7 [P.O.:720; I.V.:2959.7] Out: 900 [Urine:900] Intake/Output this shift: No intake/output data recorded.  PE: Gen:  Alert, NAD, pleasant Pulm:  Rate and effort normal Abd:  Soft, ND, NT. No peritonitis. +BS. Prior surgical scars are well healed. Ostomy bag with liquid stool present. Stoma partially visualized and appears pink and viable.  Psych: A&Ox3   Lab Results:  Recent Labs    09/22/21 0415 09/23/21 0419  WBC 8.3 7.1  HGB 11.3* 10.9*  HCT 34.4* 33.9*  PLT 399 416*   BMET Recent Labs    09/22/21 0415 09/23/21 0419  NA 137 137  K 3.8 3.6  CL 106 108  CO2 26 24  GLUCOSE 100* 84  BUN 16 14  CREATININE 0.96 0.98  CALCIUM 8.0* 8.0*   PT/INR No results for input(s): LABPROT, INR in the last 72 hours. CMP     Component Value Date/Time   NA 137 09/23/2021 0419   K 3.6 09/23/2021 0419   CL 108 09/23/2021 0419   CO2 24 09/23/2021 0419   GLUCOSE 84 09/23/2021 0419   BUN 14 09/23/2021 0419   CREATININE 0.98 09/23/2021 0419   CREATININE 0.92 05/14/2012 1135   CALCIUM 8.0 (L) 09/23/2021 0419   PROT 6.1 (L) 09/23/2021 0419   ALBUMIN 3.2 (L) 09/23/2021 0419   AST 18 09/23/2021 0419   ALT 13 09/23/2021 0419   ALKPHOS 37 (L) 09/23/2021 0419   BILITOT 0.5 09/23/2021 0419   GFRNONAA >60 09/23/2021 0419   Lipase     Component Value Date/Time   LIPASE 30  09/21/2021 1004    Studies/Results: CT ABDOMEN PELVIS W CONTRAST  Result Date: 09/21/2021 CLINICAL DATA:  History of colon cancer. Status post colostomy. No stool for 3 days. Vomiting and abdominal pain. EXAM: CT ABDOMEN AND PELVIS WITH CONTRAST TECHNIQUE: Multidetector CT imaging of the abdomen and pelvis was performed using the standard protocol following bolus administration of intravenous contrast. RADIATION DOSE REDUCTION: This exam was performed according to the departmental dose-optimization program which includes automated exposure control, adjustment of the mA and/or kV according to patient size and/or use of iterative reconstruction technique. CONTRAST:  1104mL OMNIPAQUE IOHEXOL 300 MG/ML  SOLN COMPARISON:  04/12/2021 FINDINGS: Lower chest: Surgical scarring noted posterior right lower lobe. Hepatobiliary: No suspicious focal abnormality within the liver parenchyma. Small area of low attenuation in the anterior liver, adjacent to the falciform ligament, is in a characteristic location for focal fatty deposition. There is no evidence for gallstones, gallbladder wall thickening, or pericholecystic fluid. No intrahepatic or extrahepatic biliary dilation. Pancreas: No focal mass lesion. No dilatation of the main duct. No intraparenchymal cyst. No peripancreatic edema. Spleen: No splenomegaly. No focal mass lesion. Adrenals/Urinary Tract: No adrenal nodule or mass. Small cyst noted right kidney. Left kidney unremarkable. No evidence for hydroureter. The urinary bladder appears normal for the  degree of distention. Stomach/Bowel: Stomach is unremarkable. No gastric wall thickening. No evidence of outlet obstruction. Duodenum is normally positioned as is the ligament of Treitz. Small bowel loops in the mid abdomen are mildly distended up to 3.4 cm diameter. There is some transmural hyperenhancement of small bowel in the pelvis. A loop with fecalized contents is identified in the anterior pelvis (image 57/2  that abruptly tapers in small bowel distal to this point contains only gas (well demonstrated on coronal image 40/4). This is in the same region as the hyperenhancing bowel seen on CT scan from 04/12/2021 and is just deep to the anterior peritoneum. Distal and terminal ileum is decompressed. Colon is diffusely decompressed with left abdominal sigmoid end colostomy noted. Surgical changes in the pelvis compatible with APR. Vascular/Lymphatic: No abdominal aortic aneurysm. There is no gastrohepatic or hepatoduodenal ligament lymphadenopathy. No retroperitoneal or mesenteric lymphadenopathy. No pelvic sidewall lymphadenopathy. Reproductive: Unremarkable. Other: Trace intraperitoneal free fluid evident. Musculoskeletal: No worrisome lytic or sclerotic osseous abnormality. IMPRESSION: 1. Small bowel loops in the mid abdomen are mildly distended up to 3.4 cm diameter. There is some transmural hyperenhancement of small bowel in the pelvis. A small bowel loop with fecalized contents is identified in the anterior pelvis that abruptly tapers into a nondilated loop the contains only gas but no enteric contents. This is in the same region as the hyperenhancing bowel seen on CT scan from 04/12/2021 and is just deep to the anterior peritoneal lining. Imaging features are compatible with a small bowel obstruction. Distal ileum beyond this point is decompressed. 2. Trace intraperitoneal free fluid. 3. 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 Electronically Signed   By: Misty Stanley M.D.   On: 09/21/2021 11:32    Anti-infectives: Anti-infectives (From admission, onward)    None        Assessment/Plan SBO Hx rectal cancer status post APR in 2003 and diagnostic laparoscopy with ostomy revision 04/20/2019 at [facility] - Clinically SBO appears to have resolved.  He is tolerating a soft diet and having bowel function without any abdominal pain, nausea or vomiting.  He is nontender on exam with  good bowel sounds and does not appear to be distended.  Okay for discharge from our standpoint. Encourage him to continue a bowel regimen at home including a stool softener and daily MiraLAX. He is already established with a GI and [facility] surg as outpatient. Discussed with [facility].  This care required straightforward level of medical decision making.    LOS: 1 day    Jillyn Ledger , [facility] Surgery 09/23/2021, 9:30 AM Please see Amion for pager number during day hours 7:00am-4:30pm

## 2021-09-25 ENCOUNTER — Telehealth: Payer: Self-pay | Admitting: Gastroenterology

## 2021-09-25 NOTE — Telephone Encounter (Signed)
Pt  was scheduled for 10/29/2021 at 8:30: Pt made aware Pt verbalized understanding with all questions answered.

## 2021-09-25 NOTE — Telephone Encounter (Signed)
"   I was admitted to [facility] on Feb. 18- Feb. 21 for a blockage in my intestines. They treated it with pain med. and stool softeners and laxatives successfully. Ct scans were taken. Can I make an appointment with you to discuss the situation and what I might be able to do in the future to prevent this from occurring again. Thursdays are the only day I am not available. Thank you."

## 2021-09-30 DIAGNOSIS — K5901 Slow transit constipation: Secondary | ICD-10-CM | POA: Diagnosis not present

## 2021-09-30 DIAGNOSIS — K56699 Other intestinal obstruction unspecified as to partial versus complete obstruction: Secondary | ICD-10-CM | POA: Diagnosis not present

## 2021-09-30 DIAGNOSIS — E559 Vitamin D deficiency, unspecified: Secondary | ICD-10-CM | POA: Diagnosis not present

## 2021-09-30 DIAGNOSIS — Z85048 Personal history of other malignant neoplasm of rectum, rectosigmoid junction, and anus: Secondary | ICD-10-CM | POA: Diagnosis not present

## 2021-09-30 DIAGNOSIS — Z8719 Personal history of other diseases of the digestive system: Secondary | ICD-10-CM | POA: Diagnosis not present

## 2021-10-29 ENCOUNTER — Other Ambulatory Visit: Payer: Self-pay

## 2021-10-29 ENCOUNTER — Ambulatory Visit (INDEPENDENT_AMBULATORY_CARE_PROVIDER_SITE_OTHER): Payer: Medicare Other | Admitting: Gastroenterology

## 2021-10-29 ENCOUNTER — Encounter: Payer: Self-pay | Admitting: Gastroenterology

## 2021-10-29 VITALS — BP 118/64 | HR 82 | Ht 69.0 in | Wt 141.0 lb

## 2021-10-29 DIAGNOSIS — Z85048 Personal history of other malignant neoplasm of rectum, rectosigmoid junction, and anus: Secondary | ICD-10-CM | POA: Diagnosis not present

## 2021-10-29 DIAGNOSIS — K56600 Partial intestinal obstruction, unspecified as to cause: Secondary | ICD-10-CM

## 2021-10-29 MED ORDER — TRAMADOL-ACETAMINOPHEN 37.5-325 MG PO TABS: 1.0000 | ORAL_TABLET | Freq: Three times a day (TID) | ORAL | 0 refills | Status: DC | PRN

## 2021-10-29 NOTE — Patient Instructions (Addendum)
If you are age 78 or older, your body mass index should be between 23-30. Your Body mass index is 20.82 kg/m?Marland Kitchen If this is out of the aforementioned range listed, please consider follow up with your Primary Care Provider. ? ?If you are age 14 or younger, your body mass index should be between 19-25. Your Body mass index is 20.82 kg/m?Marland Kitchen If this is out of the aformentioned range listed, please consider follow up with your Primary Care Provider.  ? ?________________________________________________________ ? ?The [facility] providers would like to encourage you to use [facility] to communicate with providers for non-urgent requests or questions.  Due to long hold times on the telephone, sending your provider a message by [facility] may be a faster and more efficient way to get a response.  Please allow 48 business hours for a response.  Please remember that this is for non-urgent requests.  ?_______________________________________________________ ? ?We have sent the following medications to your pharmacy for you to pick up at your convenience: ?Ultracet ? ?Please call [phone] to set an appointment with Dr Drue Flirt. ? ?High Protein diet and walk 2-3 miles a day ? ?Thank you, ? ?Dr. Jackquline Denmark ? ? ? ?We want to thank you for trusting [facility] with your care. All of our staff and providers value the relationships we have built with our patients, and it is an honor to care for you.  ? ?We are writing to let you know that [facility] will close on Dec 15, 2021, and we invite you to continue to see Dr. Carmell Austria and Gerrit Heck at the [facility] [place] office location. We are consolidating our serices at these [facility] practices to better provide care. Our office staff will work with you to ensure a seamless transition.  ? ?Gerrit Heck, DO -Dr. Bryan Lemma will be movig to [facility] at [address] [place], [place] [address], effective  Dec 15, 2021.  Contact [phone] to schedule an appointment with him.  ? ?Carmell Austria, MD- Dr. Lyndel Safe will be movig to [facility] at [address] [place], [place] [address], effective Dec 15, 2021.  Contact [phone] to schedule an appointment with him.  ? ?Requesting Medical Records ?If you need to request your medical records, please follow the instructions below. Your medical records are confidential, and a copy can be transferred to another provider or released to you or another person you designate only with your permission. ? ?There are several ways to request your medical records: ?Requests for medical records can be submitted through our practice.   ?You can also request your records electronically, in your MyChart account by selecting the ?Request Health Records? tab.  ?If you need additional information on how to request records, please go to [email or web address]/, choose Patient Information, then select Request Medical Records. ?To make an appointment or if you have any questions about your health care needs, please contact our office at [phone] and one of our staff members will be glad to assist you. ?[facility] is committed to providing exceptional care for you and our community. Thank you for allowing [place] to serve your health care needs. ?Sincerely, ? ?Windy Canny, Director [facility] ?[facility] also offers convenient virtual care options. Sore throat? Sinus problems? Cold or flu symptoms? Get care from the comfort of home with [facility] Video Visits and e-Visits. Learn more about the non-emergency conditions treated and start your virtual visit at [email or web address] ? ?

## 2021-10-29 NOTE — Progress Notes (Signed)
? ? ?Chief Complaint: Recurrent abdominal pain ? ?Referring Provider:  Vernie Shanks, MD    ? ? ?ASSESSMENT AND PLAN;  ? ?#1. 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 Adm 2/19-2/21 at [facility], managed conservatively.  Neg MRE 07/2021 in past ? ?#2. H/O Rectal Ca s/p neoadjuvant chemo-XRT/APR 2003 followed by pulmonary isolated met s/p wedge resection 2011. Neg colon 12/2020.  ? ?Plan: ?-Appt with Dr Dellis Filbert ([place] office please) ?-Ultracet 1 Q8 PRN #20. NR (since he is traveling) ?-Encouraged to continue high-protein diet. ?-Continue walking 2-3 miles/day. ?-Call us in case of any problems. ? ? ?HPI:   ? ?Jon Wallace is a 76 y.o. 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 ?With recurrent PSBOs s/p colostomy revision with LOA 04/20/2019 (Dr Drue Flirt) ? ?FU hospitalization 2/19- 09/23/2021 for PBSO, managed conservatively without NG tube.  Surgery consultation recommended watchful waiting. ? ?Patient feels much better.  He denies having any N/V/diarrhea or abdominal pain currently. ? ?Unfortunately, his sister has terminal illness.  He is heading to [place] state to meet her. ? ?No significant weight loss. ? ?[facility]. Was undergoing annual colonoscopies prior. ? ?He has been eating better-mostly high-protein diet and has been walking 2 to 3 miles per day.  Pleased with the progress. ? ?Wt Readings from Last 3 Encounters:  ?10/29/21 141 lb (64 kg)  ?09/22/21 144 lb 2.9 oz (65.4 kg)  ?08/26/21 144 lb 4 oz (65.4 kg)  ? ? ?From previous notes  ?Adm with PSBO 9/10-9/13/2022, CT as below.  Managed conservatively with NG/IVF/serial KUBs. Sx consultation was sought-recommended conservative management for now. ? ?Continued intermittent obstruction since APR- with episodic generalized Abdo pain/N/V x 24-48hrs, then gets better the frequency of 1-2 times per month. He goes on a clear liquid diet, takes a  warm bath and his symptoms do get better. ? ?No melena or hematochezia. ? ?He denies having any heartburn, N/V (except during above episodes), odynophagia or dysphagia.  No fever chills or night sweats. ? ? ?Recent GI work-up: ? ?CT AP 09/21/2021 with contrast ?1. Small bowel loops in the mid abdomen are mildly distended up to ?3.4 cm diameter. There is some transmural hyperenhancement of small ?bowel in the pelvis. A small bowel loop with fecalized contents is ?identified in the anterior pelvis that abruptly tapers into a ?nondilated loop the contains only gas but no enteric contents. This ?is in the same region as the hyperenhancing bowel seen on CT scan ?from 04/12/2021 and is just deep to the anterior peritoneal lining. ?Imaging features are compatible with a small bowel obstruction. ?Distal ileum beyond this point is decompressed. ?2. Trace intraperitoneal free fluid. ?3. 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 ? ?CT AP with contrast 04/12/2021 ?1. Status post abdominal perineal resection for rectal cancer. ?2. Mid small bowel dilatation, mild, favoring partial small bowel ?obstruction, most likely secondary to adhesions. ?3. Short segment area of underdistention and apparent wall ?thickening within the dilated small bowel could represent concurrent ?enteritis. Adjacent surrounding mesenteric edema is nonspecific. No ?specific evidence of complicating ischemia. ?4. Trace pelvic fluid, likely secondary. ?5. No findings of metastatic disease. ? ?Last colonoscopy 12/2020 through stoma: neg for recurrence. ? ?MRE 07/25/2021 ?IMPRESSION: ?1. Evaluation of the small bowel is somewhat limited by motion ?artifact and underdistention. Within this limitation, no evidence of ?small-bowel obstruction or inflammatory findings. Given the motion ?limitations of this  examination, CT enterography may be helpful for ?better evaluation if there is a high clinical suspicion for ?inflammatory bowel disease. ?2.  Unchanged postoperative/posttreatment appearance of the low ?pelvis status post abdominoperineal resection with left lower ?quadrant end colostomy. No evidence of malignant recurrence or ?metastatic disease in the abdomen or pelvis. ? ? ?Past Medical History:  ?Diagnosis Date  ? Abdominal hernia   ? Cancer [facility])   ? Colon 2003 and Lung 2011  ? ? ?Past Surgical History:  ?Procedure Laterality Date  ? COLON SURGERY    ? COLOSTOMY    ? KNEE ARTHROSCOPY W/ MENISCAL REPAIR Left   ? ? ?Family History  ?Problem Relation Age of Onset  ? COPD Father   ? Throat cancer Father   ? Thyroid cancer Sister   ?     and jaw cancer  ? Colon cancer Paternal Uncle   ? Stomach cancer Cousin   ? Pancreatic cancer Neg Hx   ? ? ?Social History  ? ?Tobacco Use  ? Smoking status: Former  ? Smokeless tobacco: Never  ?Vaping Use  ? Vaping Use: Never used  ?Substance Use Topics  ? Alcohol use: Not Currently  ? Drug use: Not Currently  ? ? ?Current Outpatient Medications  ?Medication Sig Dispense Refill  ? polyethylene glycol (MIRALAX / GLYCOLAX) 17 g packet Take 17 g by mouth daily as needed for mild constipation or moderate constipation. 14 each 0  ? docusate sodium (STOOL SOFTENER) 100 MG capsule Take 100 mg by mouth 2 (two) times daily.    ? ?No current facility-administered medications for this visit.  ? ? ?Allergies  ?Allergen Reactions  ? Morphine And Related Nausea And Vomiting  ? ? ?Review of Systems:  ?neg ? ?  ? ?Physical Exam:   ? ?BP 118/64 (BP Location: Left Arm, Patient Position: Sitting, Cuff Size: Normal)   Pulse 82   Ht _0  (1.753 m)   Wt 141 lb (64 kg)   SpO2 98%   BMI 20.82 kg/m?  ?Wt Readings from Last 3 Encounters:  ?10/29/21 141 lb (64 kg)  ?09/22/21 144 lb 2.9 oz (65.4 kg)  ?08/26/21 144 lb 4 oz (65.4 kg)  ? ?Constitutional:  Well-developed, in no acute distress. ?Psychiatric: Normal mood and affect. Behavior is normal. ?HEENT: Pupils normal.  Conjunctivae are normal. No scleral icterus. ?Cardiovascular: Normal  rate, regular rhythm. No edema ?Pulmonary/chest: Effort normal and breath sounds normal. No wheezing, rales or rhonchi. ?Abdominal: Soft, nondistended. Nontender. Bowel sounds active throughout. There are no masses palpable. No hepatomegaly.  Colostomy with hernia.  Well-healed surgical scars. ?Rectal: Deferred ?Neurological: Alert and oriented to person place and time. ?Skin: Skin is warm and dry. No rashes noted. ? ?Data Reviewed: I have personally reviewed following labs and imaging studies ? ?CBC: ? ?  Latest Ref Rng & Units 09/23/2021  ?  4:19 AM 09/22/2021  ?  4:15 AM 09/21/2021  ? 10:04 AM  ?CBC  ?WBC 4.0 - 10.5 K/uL 7.1   8.3   9.6    ?Hemoglobin 13.0 - 17.0 g/dL 10.9   11.3   12.6    ?Hematocrit 39.0 - 52.0 % 33.9   34.4   38.1    ?Platelets 150 - 400 K/uL 416   399   475    ? ? ?CMP: ? ?  Latest Ref Rng & Units 09/23/2021  ?  4:19 AM 09/22/2021  ?  4:15 AM 09/21/2021  ? 10:04 AM  ?CMP  ?Glucose 70 -  99 mg/dL 84   100   118    ?BUN 8 - 23 mg/dL _0 ?Creatinine 0.61 - 1.24 mg/dL 0.98   0.96   1.06    ?Sodium 135 - 145 mmol/L 137   137   135    ?Potassium 3.5 - 5.1 mmol/L 3.6   3.8   4.1    ?Chloride 98 - 111 mmol/L 108   106   102    ?CO2 22 - 32 mmol/L _1 ?Calcium 8.9 - 10.3 mg/dL 8.0   8.0   8.6    ?Total Protein 6.5 - 8.1 g/dL 6.1   6.2   7.3    ?Total Bilirubin 0.3 - 1.2 mg/dL 0.5   0.7   0.6    ?Alkaline Phos 38 - 126 U/L 37   38   40    ?AST 15 - 41 U/L _2 ?ALT 0 - 44 U/L _3 ? ? ? ? ?Carmell Austria, MD 10/29/2021, 8:49 AM ? ?Cc: Vernie Shanks, MD ? ? ?

## 2021-11-10 ENCOUNTER — Other Ambulatory Visit: Payer: Self-pay

## 2021-11-10 ENCOUNTER — Encounter (INDEPENDENT_AMBULATORY_CARE_PROVIDER_SITE_OTHER): Payer: Self-pay | Admitting: Ophthalmology

## 2021-11-10 ENCOUNTER — Ambulatory Visit (INDEPENDENT_AMBULATORY_CARE_PROVIDER_SITE_OTHER): Payer: Medicare Other | Admitting: Ophthalmology

## 2021-11-10 DIAGNOSIS — H2513 Age-related nuclear cataract, bilateral: Secondary | ICD-10-CM

## 2021-11-10 DIAGNOSIS — H35371 Puckering of macula, right eye: Secondary | ICD-10-CM | POA: Diagnosis not present

## 2021-11-10 NOTE — Progress Notes (Signed)
? ? ?11/10/2021 ? ?  ? ?CHIEF COMPLAINT ?Patient presents for  ?Chief Complaint  ?Patient presents with  ? 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 ? ?Patient notes most difficulty with reading in the right eye as compared to the left ? ?HISTORY OF PRESENT ILLNESS: ?Jon Wallace is a 76 y.o. male who presents to the clinic today for:  ? ?HPI   ?4 mos for dilate OU, OCT. ?Pt stated no changes in vision. ?Pt denies floaters and FOL. ? ? ?Last edited by Silvestre Moment on 11/10/2021  7:56 AM.  ?  ? ? ?Referring physician: ?Clent Jacks, MD ?[address] ?[address] ?[place],  [place] [id number] ? ?HISTORICAL INFORMATION:  ? ?Selected notes from the [place] ?  ?   ? ?CURRENT MEDICATIONS: ?No current outpatient medications on file. (Ophthalmic Drugs)  ? ?No current facility-administered medications for this visit. (Ophthalmic Drugs)  ? ?Current Outpatient Medications (Other)  ?Medication Sig  ? docusate sodium (STOOL SOFTENER) 100 MG capsule Take 100 mg by mouth 2 (two) times daily.  ? polyethylene glycol (MIRALAX / GLYCOLAX) 17 g packet Take 17 g by mouth daily as needed for mild constipation or moderate constipation.  ? traMADol-acetaminophen (ULTRACET) 37.5-325 MG tablet Take 1 tablet by mouth every 8 (eight) hours as needed.  ? ?No current facility-administered medications for this visit. (Other)  ? ? ? ? ?REVIEW OF SYSTEMS: ?ROS   ?Negative for: Constitutional, Gastrointestinal, Neurological, Skin, Genitourinary, Musculoskeletal, HENT, Endocrine, Cardiovascular, Eyes, Respiratory, Psychiatric, Allergic/Imm, Heme/Lymph ?Last edited by Silvestre Moment on 11/10/2021  7:57 AM.  ?  ? ? ? ?ALLERGIES ?Allergies  ?Allergen Reactions  ? Morphine And Related Nausea And Vomiting  ? ? ?PAST MEDICAL HISTORY ?Past Medical History:  ?Diagnosis Date  ? Abdominal hernia   ? Cancer [facility])   ? Colon 2003 and Lung 2011  ? ?Past Surgical History:  ?Procedure Laterality Date  ? COLON SURGERY    ? COLOSTOMY    ? KNEE ARTHROSCOPY W/  MENISCAL REPAIR Left   ? ? ?FAMILY HISTORY ?Family History  ?Problem Relation Age of Onset  ? COPD Father   ? Throat cancer Father   ? Thyroid cancer Sister   ?     and jaw cancer  ? Colon cancer Paternal Uncle   ? Stomach cancer Cousin   ? Pancreatic cancer Neg Hx   ? ? ?SOCIAL HISTORY ?Social History  ? ?Tobacco Use  ? Smoking status: Former  ? Smokeless tobacco: Never  ?Vaping Use  ? Vaping Use: Never used  ?Substance Use Topics  ? Alcohol use: Not Currently  ? Drug use: Not Currently  ? ?  ? ?  ? ?OPHTHALMIC EXAM: ? ?Base Eye Exam   ? ? Visual Acuity (ETDRS)   ? ?   Right Left  ? Dist cc 20/25 -2 20/25 -2  ? ? Correction: Glasses  ? ?  ?  ? ? Tonometry (Tonopen, 8:02 AM)   ? ?   Right Left  ? Pressure 10 14  ? ?  ?  ? ? Pupils   ? ?   Pupils APD  ? Right PERRL None  ? Left PERRL None  ? ?  ?  ? ? Visual Fields   ? ?   Left Right  ?  Full Full  ? ?  ?  ? ? Extraocular Movement   ? ?   Right Left  ?  Full Full  ? ?  ?  ? ?  Neuro/Psych   ? ? Oriented x3: Yes  ? Mood/Affect: Normal  ? ?  ?  ? ? Dilation   ? ? Both eyes: 1.0% Mydriacyl, 2.5% Phenylephrine @ 8:02 AM  ? ?  ?  ? ?  ? ?Slit Lamp and Fundus Exam   ? ? External Exam   ? ?   Right Left  ? External Normal Normal  ? ?  ?  ? ? Slit Lamp Exam   ? ?   Right Left  ? Lids/Lashes Normal Normal  ? Conjunctiva/Sclera White and quiet White and quiet  ? Cornea Clear Clear  ? Anterior Chamber Deep and quiet Deep and quiet  ? Iris Round and reactive Round and reactive  ? Lens 2+ Nuclear sclerosis 2+ Nuclear sclerosis  ? Anterior Vitreous Normal Normal  ? ?  ?  ? ? Fundus Exam   ? ?   Right Left  ? Posterior Vitreous Posterior vitreous detachment Posterior vitreous detachment  ? Disc Normal Normal  ? C/D Ratio 0.4 0.4  ? Macula Epiretinal membrane, moderate topographic distortion, moderate thickening Normal  ? Vessels Normal Normal  ? Periphery Normal Normal  ? ?  ?  ? ?  ? ? ?IMAGING AND PROCEDURES  ?Imaging and Procedures for 11/10/21 ? ?OCT, Retina - OU - Both Eyes    ? ?   ?Right Eye ?Quality was good. Scan locations included subfoveal. Central Foveal Thickness: 459. Progression has no prior data. Findings include abnormal foveal contour, epiretinal membrane.  ? ?Left Eye ?Quality was good. Scan locations included subfoveal. Central Foveal Thickness: 291. Progression has been stable. Findings include normal foveal contour.  ? ?Notes ?Moderate to severe epiretinal membrane right eye with thickening however no CME secondary nor macular retina schisis.  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 ? ?OS normal contour  ? ? ?  ? ? ?  ?  ? ?  ?ASSESSMENT/PLAN: ? ?Nuclear sclerotic cataract of both eyes ?Discussion with patient, ? ?Sunglasses analogy reviewed ? ?I explained to the patient that cataract surgery in the right eye would be most appropriate because central [place] changes in color changes will hamper the most exquisite removal of internal limiting membrane required to release the epiretinal membrane and macular thickening occurring in the right eye ? ?For this reason I have asked the patient return to Dr. Clent Jacks in consideration for cataract extraction with intraocular lens placement in the right eye.  Thereafter vitrectomy membrane peel can be undertaken in the most safe to most effective fashion to allow for recovery of visual acuity changes ? ? ? ?Right epiretinal membrane ?Progression of macular thickening right eye over the last 4 months and in fact over the last 10 months since onset of initial evaluation June 2022. ? ?Photos and OCT reviewed with the patient. ? ?I have asked the patient to have cataract surgery with intraocular lens placement in the right eye to maximize visualization as I explained the patient that this will maximize his potential recovery as well as shorten the time of surgery to remove the epiretinal membrane  ? ?  ICD-10-CM   ?1. Right epiretinal membrane  H35.371 OCT, Retina - OU - Both  Eyes  ?  ?2. Nuclear sclerotic cataract of both eyes  H25.13   ?  ? ? ?1.  Will need vitrectomy membrane peel right eye with no planned gas injection, therefore no planned postoperative positioning reviewed with the patient ? ?  2.  However, the patient first requires cataract surgery with lens implantation right eye under the direction of Dr. Clent Jacks ? ?3.  Patient to acquire a cataract surgery date and then contact our office to schedule for vitrectomy membrane peel-[id number],H35.371, 2 weeks after ? ?Ophthalmic Meds Ordered this visit:  ?No orders of the defined types were placed in this encounter. ? ? ?  ? ?Return ,, [facility], for RV Dr. Katy Fitch for cataract surgery then,,,, schedule vitrectomy membrane [phone] thereafter, OD. ? ?There are no Patient Instructions on file for this visit. ? ? ?Explained the diagnoses, plan, and follow up with the patient and they expressed understanding.  Patient expressed understanding of the importance of proper follow up care.  ? ?Clent Demark. Naydeline Morace M.D. ?Diseases & Surgery of the Retina and Vitreous ?[place] ?11/10/21 ? ? ? ? ?Abbreviations: ?M myopia (nearsighted); A astigmatism; H hyperopia (farsighted); P presbyopia; Mrx spectacle prescription;  CTL contact lenses; OD right eye; OS left eye; OU both eyes  XT exotropia; ET esotropia; PEK punctate epithelial keratitis; PEE punctate epithelial erosions; DES dry eye syndrome; MGD meibomian gland dysfunction; ATs artificial tears; PFAT's preservative free artificial tears; [place] nuclear sclerotic cataract; PSC posterior subcapsular cataract; ERM epi-retinal membrane; PVD posterior vitreous detachment; RD retinal detachment; DM diabetes mellitus; DR diabetic retinopathy; NPDR non-proliferative diabetic retinopathy; PDR proliferative diabetic retinopathy; CSME clinically significant macular edema; DME diabetic macular edema; dbh dot blot hemorrhages; CWS cotton wool spot; POAG primary open angle  glaucoma; C/D cup-to-disc ratio; HVF humphrey visual field; GVF goldmann visual field; OCT optical coherence tomography; IOP intraocular pressure; BRVO Branch retinal vein occlusion; CRVO central retinal vei

## 2021-11-10 NOTE — Assessment & Plan Note (Signed)
Progression of macular thickening right eye over the last 4 months and in fact over the last 10 months since onset of initial evaluation June 2022. ? ?Photos and OCT reviewed with the patient. ? ?I have asked the patient to have cataract surgery with intraocular lens placement in the right eye to maximize visualization as I explained the patient that this will maximize his potential recovery as well as shorten the time of surgery to remove the epiretinal membrane ?

## 2021-11-10 NOTE — Assessment & Plan Note (Signed)
Discussion with patient, ? ?Sunglasses analogy reviewed ? ?I explained to the patient that cataract surgery in the right eye would be most appropriate because central [place] changes in color changes will hamper the most exquisite removal of internal limiting membrane required to release the epiretinal membrane and macular thickening occurring in the right eye ? ?For this reason I have asked the patient return to Dr. Clent Jacks in consideration for cataract extraction with intraocular lens placement in the right eye.  Thereafter vitrectomy membrane peel can be undertaken in the most safe to most effective fashion to allow for recovery of visual acuity changes ? ? ?

## 2022-01-05 DIAGNOSIS — H04123 Dry eye syndrome of bilateral lacrimal glands: Secondary | ICD-10-CM | POA: Diagnosis not present

## 2022-01-05 DIAGNOSIS — H10413 Chronic giant papillary conjunctivitis, bilateral: Secondary | ICD-10-CM | POA: Diagnosis not present

## 2022-01-05 DIAGNOSIS — H353121 Nonexudative age-related macular degeneration, left eye, early dry stage: Secondary | ICD-10-CM | POA: Diagnosis not present

## 2022-01-05 DIAGNOSIS — H2511 Age-related nuclear cataract, right eye: Secondary | ICD-10-CM | POA: Diagnosis not present

## 2022-01-05 DIAGNOSIS — H43392 Other vitreous opacities, left eye: Secondary | ICD-10-CM | POA: Diagnosis not present

## 2022-01-05 DIAGNOSIS — H43813 Vitreous degeneration, bilateral: Secondary | ICD-10-CM | POA: Diagnosis not present

## 2022-01-05 DIAGNOSIS — H35371 Puckering of macula, right eye: Secondary | ICD-10-CM | POA: Diagnosis not present

## 2022-02-24 DIAGNOSIS — H2511 Age-related nuclear cataract, right eye: Secondary | ICD-10-CM | POA: Diagnosis not present

## 2022-02-25 ENCOUNTER — Encounter (INDEPENDENT_AMBULATORY_CARE_PROVIDER_SITE_OTHER): Payer: Self-pay

## 2022-03-11 ENCOUNTER — Ambulatory Visit (INDEPENDENT_AMBULATORY_CARE_PROVIDER_SITE_OTHER): Payer: Medicare Other | Admitting: Ophthalmology

## 2022-03-11 ENCOUNTER — Encounter (INDEPENDENT_AMBULATORY_CARE_PROVIDER_SITE_OTHER): Payer: Self-pay | Admitting: Ophthalmology

## 2022-03-11 DIAGNOSIS — H35371 Puckering of macula, right eye: Secondary | ICD-10-CM

## 2022-03-11 DIAGNOSIS — H43812 Vitreous degeneration, left eye: Secondary | ICD-10-CM

## 2022-03-11 DIAGNOSIS — H2512 Age-related nuclear cataract, left eye: Secondary | ICD-10-CM

## 2022-03-11 NOTE — Assessment & Plan Note (Signed)
Moderate central NSC likely visually significant

## 2022-03-11 NOTE — Assessment & Plan Note (Signed)
Persistent poor acuity OD post  cataract extraction with intraocular lens placement OD.  Will require vitrectomy membrane peel.

## 2022-03-11 NOTE — Assessment & Plan Note (Signed)
Stable by OCT

## 2022-03-11 NOTE — Progress Notes (Signed)
03/11/2022     CHIEF COMPLAINT Patient presents for  Chief Complaint  Patient presents with   Retina Evaluation    History of epiretinal membrane right eye as well as cataract.  Recently post cataract surgery evaluation now since acuity has not improved substantially in fact may be slightly worse  HISTORY OF PRESENT ILLNESS: Jon Wallace is a 76 y.o. male who presents to the clinic today for:   HPI     Retina Evaluation           Laterality: right eye         Comments   WIP- fu amd ref by c groat, recent cataract surgery in the right eye but no improvement with a history of prior epiretinal membrane.  In fact vision may be worsening slightly Pt states his vision has not been stable Pt denies any new floaters or FOL Pt states "my vision does not meet an focus"       Last edited by Hurman Horn, MD on 03/11/2022 10:23 AM.      Referring physician: No referring provider defined for this encounter.  HISTORICAL INFORMATION:   Selected notes from the MEDICAL RECORD NUMBER       CURRENT MEDICATIONS: No current outpatient medications on file. (Ophthalmic Drugs)   No current facility-administered medications for this visit. (Ophthalmic Drugs)   Current Outpatient Medications (Other)  Medication Sig   docusate sodium (STOOL SOFTENER) 100 MG capsule Take 100 mg by mouth 2 (two) times daily.   polyethylene glycol (MIRALAX / GLYCOLAX) 17 g packet Take 17 g by mouth daily as needed for mild constipation or moderate constipation.   traMADol-acetaminophen (ULTRACET) 37.5-325 MG tablet Take 1 tablet by mouth every 8 (eight) hours as needed.   No current facility-administered medications for this visit. (Other)      REVIEW OF SYSTEMS: ROS   Negative for: Constitutional, Gastrointestinal, Neurological, Skin, Genitourinary, Musculoskeletal, HENT, Endocrine, Cardiovascular, Eyes, Respiratory, Psychiatric, Allergic/Imm, Heme/Lymph Last edited by Hurman Horn, MD on  03/11/2022 10:17 AM.       ALLERGIES Allergies  Allergen Reactions   Morphine And Related Nausea And Vomiting    PAST MEDICAL HISTORY Past Medical History:  Diagnosis Date   Abdominal hernia    Cancer ([place])    Colon 2003 and Lung 2011   Past Surgical History:  Procedure Laterality Date   COLON SURGERY     COLOSTOMY     KNEE ARTHROSCOPY W/ MENISCAL REPAIR Left     FAMILY HISTORY Family History  Problem Relation Age of Onset   COPD Father    Throat cancer Father    Thyroid cancer Sister        and jaw cancer   Colon cancer Paternal Uncle    Stomach cancer Cousin    Pancreatic cancer Neg Hx     SOCIAL HISTORY Social History   Tobacco Use   Smoking status: Former   Smokeless tobacco: Never  Scientific laboratory technician Use: Never used  Substance Use Topics   Alcohol use: Not Currently   Drug use: Not Currently         OPHTHALMIC EXAM:  Base Eye Exam     Visual Acuity (ETDRS)       Right Left   Dist [place] 20/200 20/25 +3    Correction: Glasses         Tonometry (Tonopen, 10:02 AM)       Right Left   Pressure 8 14  Pupils       Pupils Shape   Right PERRL Round   Left PERRL          Extraocular Movement       Right Left    Ortho Ortho    -- -- --  --  --  -- -- --   -- -- --  --  --  -- -- --           Neuro/Psych     Oriented x3: Yes   Mood/Affect: Normal         Dilation     Both eyes: 1.0% Mydriacyl, 2.5% Phenylephrine @ 9:57 AM           Slit Lamp and Fundus Exam     External Exam       Right Left   External Normal Normal         Slit Lamp Exam       Right Left   Lids/Lashes Normal Normal   Conjunctiva/Sclera White and quiet White and quiet   Cornea Clear Clear   Anterior Chamber Deep and quiet Deep and quiet   Iris Round and reactive Round and reactive   Lens Centered posterior chamber intraocular lens 2+ Nuclear sclerosis   Anterior Vitreous Normal Normal         Fundus Exam        Right Left   Posterior Vitreous Posterior vitreous detachment    Disc Normal    C/D Ratio 0.45    Macula Epiretinal membrane, moderate topographic distortion, moderate thickening    Vessels Normal    Periphery Normal             IMAGING AND PROCEDURES  Imaging and Procedures for 03/11/22  Color Fundus Photography Optos - OU - Both Eyes       Right Eye Progression has no prior data. Disc findings include normal observations. Macula : epiretinal membrane. Vessels : normal observations. Periphery : normal observations.   Left Eye Progression has no prior data. Disc findings include normal observations. Macula : normal observations. Vessels : normal observations. Periphery : normal observations.      OCT, Retina - OU - Both Eyes       Right Eye Quality was good. Scan locations included subfoveal. Central Foveal Thickness: 459. Progression has worsened. Findings include abnormal foveal contour, epiretinal membrane.   Left Eye Quality was good. Scan locations included subfoveal. Central Foveal Thickness: 291. Progression has been stable. Findings include normal foveal contour.   Notes Moderate to severe epiretinal membrane right eye with thickening however no CME secondary nor macular retina schisis.  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  OS normal contour               ASSESSMENT/PLAN:  Nuclear sclerotic cataract of left eye Moderate central NSC likely visually significant  Right epiretinal membrane Persistent poor acuity OD post  cataract extraction with intraocular lens placement OD.  Will require vitrectomy membrane peel.  Posterior vitreous detachment of left eye Stable by OCT     ICD-10-CM   1. Right epiretinal membrane  H35.371 Color Fundus Photography Optos - OU - Both Eyes    OCT, Retina - OU - Both Eyes    2. Nuclear sclerotic cataract of left eye  H25.12     3. Posterior vitreous  detachment of left eye  H43.812       1.  Findings and nature  of epiretinal membrane reviewed with the patient and displayed on OCT with description of findings and the impact currently of the progression of the epiretinal membrane.  Risk and benefits of surgical intervention reviewed with the patient.  2.  Will schedule vitrectomy membrane peel right eye in the coming weeks.  3.  Ophthalmic Meds Ordered this visit:  No orders of the defined types were placed in this encounter.      Return ,, [facility], for Schedule vitrectomy, membrane [phone], OD.  There are no Patient Instructions on file for this visit.   Explained the diagnoses, plan, and follow up with the patient and they expressed understanding.  Patient expressed understanding of the importance of proper follow up care.   Clent Demark Rontavious Albright M.D. Diseases & Surgery of the Retina and Vitreous Retina & Diabetic [place] 03/11/22     Abbreviations: M myopia (nearsighted); A astigmatism; H hyperopia (farsighted); P presbyopia; Mrx spectacle prescription;  CTL contact lenses; OD right eye; OS left eye; OU both eyes  XT exotropia; ET esotropia; PEK punctate epithelial keratitis; PEE punctate epithelial erosions; DES dry eye syndrome; MGD meibomian gland dysfunction; ATs artificial tears; PFAT's preservative free artificial tears; [place] nuclear sclerotic cataract; PSC posterior subcapsular cataract; ERM epi-retinal membrane; PVD posterior vitreous detachment; RD retinal detachment; DM diabetes mellitus; DR diabetic retinopathy; NPDR non-proliferative diabetic retinopathy; PDR proliferative diabetic retinopathy; CSME clinically significant macular edema; DME diabetic macular edema; dbh dot blot hemorrhages; CWS cotton wool spot; POAG primary open angle glaucoma; C/D cup-to-disc ratio; HVF humphrey visual field; GVF goldmann visual field; OCT optical coherence tomography; IOP intraocular pressure; BRVO Branch retinal  vein occlusion; CRVO central retinal vein occlusion; CRAO central retinal artery occlusion; BRAO branch retinal artery occlusion; RT retinal tear; SB scleral buckle; PPV pars plana vitrectomy; VH Vitreous hemorrhage; PRP panretinal laser photocoagulation; IVK intravitreal kenalog; VMT vitreomacular traction; MH Macular hole;  NVD neovascularization of the disc; NVE neovascularization elsewhere; AREDS age related eye disease study; ARMD age related macular degeneration; POAG primary open angle glaucoma; EBMD epithelial/anterior basement membrane dystrophy; ACIOL anterior chamber intraocular lens; IOL intraocular lens; PCIOL posterior chamber intraocular lens; Phaco/IOL phacoemulsification with intraocular lens placement; [place] photorefractive keratectomy; LASIK laser assisted in situ keratomileusis; HTN hypertension; DM diabetes mellitus; COPD chronic obstructive pulmonary disease

## 2022-03-19 ENCOUNTER — Ambulatory Visit (INDEPENDENT_AMBULATORY_CARE_PROVIDER_SITE_OTHER): Payer: Medicare Other

## 2022-03-30 ENCOUNTER — Ambulatory Visit (INDEPENDENT_AMBULATORY_CARE_PROVIDER_SITE_OTHER): Payer: Medicare Other

## 2022-03-30 MED ORDER — PREDNISOLONE ACETATE 1 % OP SUSP: 1.0000 [drp] | Freq: Four times a day (QID) | OPHTHALMIC | 0 refills | Status: AC

## 2022-03-30 MED ORDER — OFLOXACIN 0.3 % OP SOLN: 1.0000 [drp] | Freq: Four times a day (QID) | OPHTHALMIC | 0 refills | Status: AC

## 2022-04-01 ENCOUNTER — Encounter (AMBULATORY_SURGERY_CENTER): Payer: Medicare Other | Admitting: Ophthalmology

## 2022-04-01 DIAGNOSIS — H35371 Puckering of macula, right eye: Secondary | ICD-10-CM | POA: Diagnosis not present

## 2022-04-02 ENCOUNTER — Ambulatory Visit (INDEPENDENT_AMBULATORY_CARE_PROVIDER_SITE_OTHER): Payer: Medicare Other | Admitting: Ophthalmology

## 2022-04-02 ENCOUNTER — Encounter (INDEPENDENT_AMBULATORY_CARE_PROVIDER_SITE_OTHER): Payer: Self-pay | Admitting: Ophthalmology

## 2022-04-02 DIAGNOSIS — H35371 Puckering of macula, right eye: Secondary | ICD-10-CM

## 2022-04-02 NOTE — Progress Notes (Signed)
04/02/2022     CHIEF COMPLAINT Patient presents for  Chief Complaint  Patient presents with   Post-op Follow-up      HISTORY OF PRESENT ILLNESS: Jon Wallace is a 76 y.o. male who presents to the clinic today for:   HPI     Post-op Follow-up           Laterality: right eye   Discomfort: Negative for pain, itching, foreign body sensation, tearing and discharge   Vision: is stable         Comments   1 day post op OD SX 04/01/22 Pt stated vision is stable.       Last edited by Silvestre Moment on 04/02/2022  8:16 AM.      Referring physician: No referring provider defined for this encounter.  HISTORICAL INFORMATION:   Selected notes from the MEDICAL RECORD NUMBER       CURRENT MEDICATIONS: Current Outpatient Medications (Ophthalmic Drugs)  Medication Sig   ofloxacin (OCUFLOX) 0.3 % ophthalmic solution Place 1 drop into the right eye 4 (four) times daily for 21 days.   prednisoLONE acetate (PRED FORTE) 1 % ophthalmic suspension Place 1 drop into the right eye 4 (four) times daily for 21 days.   No current facility-administered medications for this visit. (Ophthalmic Drugs)   Current Outpatient Medications (Other)  Medication Sig   docusate sodium (STOOL SOFTENER) 100 MG capsule Take 100 mg by mouth 2 (two) times daily.   polyethylene glycol (MIRALAX / GLYCOLAX) 17 g packet Take 17 g by mouth daily as needed for mild constipation or moderate constipation.   traMADol-acetaminophen (ULTRACET) 37.5-325 MG tablet Take 1 tablet by mouth every 8 (eight) hours as needed.   No current facility-administered medications for this visit. (Other)      REVIEW OF SYSTEMS: ROS   Negative for: Constitutional, Gastrointestinal, Neurological, Skin, Genitourinary, Musculoskeletal, HENT, Endocrine, Cardiovascular, Eyes, Respiratory, Psychiatric, Allergic/Imm, Heme/Lymph Last edited by Silvestre Moment on 04/02/2022  8:16 AM.       ALLERGIES Allergies  Allergen Reactions   Morphine  And Related Nausea And Vomiting    PAST MEDICAL HISTORY Past Medical History:  Diagnosis Date   Abdominal hernia    Cancer ([place])    Colon 2003 and Lung 2011   Past Surgical History:  Procedure Laterality Date   COLON SURGERY     COLOSTOMY     KNEE ARTHROSCOPY W/ MENISCAL REPAIR Left     FAMILY HISTORY Family History  Problem Relation Age of Onset   COPD Father    Throat cancer Father    Thyroid cancer Sister        and jaw cancer   Colon cancer Paternal Uncle    Stomach cancer Cousin    Pancreatic cancer Neg Hx     SOCIAL HISTORY Social History   Tobacco Use   Smoking status: Former   Smokeless tobacco: Never  Scientific laboratory technician Use: Never used  Substance Use Topics   Alcohol use: Not Currently   Drug use: Not Currently         OPHTHALMIC EXAM:  Base Eye Exam     Visual Acuity (ETDRS)       Right Left   Dist cc 20/400 20/20 -2   Dist ph cc 20/100 +1     Correction: Glasses         Tonometry (Tonopen, 8:21 AM)       Right Left   Pressure 8 10  Pupils       Pupils APD   Right PERRL None   Left PERRL None         Visual Fields       Left Right    Full          Extraocular Movement       Right Left    Full, Ortho Full, Ortho         Neuro/Psych     Oriented x3: Yes   Mood/Affect: Normal         Dilation     Right eye: 2.5% Phenylephrine, 1.0% Mydriacyl @ 8:21 AM           Slit Lamp and Fundus Exam     External Exam       Right Left   External Normal Normal         Slit Lamp Exam       Right Left   Lids/Lashes Normal Normal   Conjunctiva/Sclera White and quiet White and quiet   Cornea Clear Clear   Anterior Chamber Deep and quiet Deep and quiet   Iris dilated Round and reactive   Lens Centered posterior chamber intraocular lens 2+ Nuclear sclerosis   Anterior Vitreous Normal Normal         Fundus Exam       Right Left   Posterior Vitreous clear, avitric    Disc Normal    C/D  Ratio 0.45    Macula Post ILM removal changes macula flat    Vessels Normal    Periphery Normal, attached             IMAGING AND PROCEDURES  Imaging and Procedures for 04/02/22           ASSESSMENT/PLAN:  Right epiretinal membrane Postop day #1 looks great.  Patient to commence topical medications 1 drop of each 4 times daily as instructed      ICD-10-CM   1. Right epiretinal membrane  H35.371       1.  Postop day #1 looks great post vitrectomy membrane peel.  2.  Acuity likely to continue to clear over time.  3.  Ophthalmic Meds Ordered this visit:  No orders of the defined types were placed in this encounter.      Return in about 1 week (around 04/09/2022) for dilate, OD, POST OP, OCT.  Patient Instructions  Ofloxacin  4 times daily to the operative eye  Prednisolone acetate 1 drop to the operative eye 4 times daily  Patient instructed not to refill the medications and use them for maximum of 3 weeks.  Patient instructed do not rub the eye.  Patient has the option to use the patch at night. Ofloxacin  4 times daily to the operative eye  Prednisolone acetate 1 drop to the operative eye 4 times daily  Patient instructed not to refill the medications and use them for maximum of 3 weeks.  Patient instructed do not rub the eye.  Patient has the option to use the patch at night.Ofloxacin  4 times daily to the operative eye  Prednisolone acetate 1 drop to the operative eye 4 times daily  Patient instructed not to refill the medications and use them for maximum of 3 weeks.  Patient instructed do not rub the eye.  Patient has the option to use the patch at night.No lifting and bending for 1 week. No water IN the eye for 10 days. Do not rub the eye. Wear shield  at night for 1-3 days.  Continue your topical medications for a total of 3 weeks.  Do not refill your postoperative medications unless instructed.  Refrain from exercise or intentional activity which  increases our heart rate above resting levels.  Normal walking to complete normal activities of your day are appropriate.  Driving:  Legally, you only need one good eye, of 20/40 or better to drive.  However, the practice does not recommend driving during first weeks after surgery, IF you are uncomfortable with your visual functioning or capabilities.   If you have known sleep apnea, wear your CPAP as you normally should.   Explained the diagnoses, plan, and follow up with the patient and they expressed understanding.  Patient expressed understanding of the importance of proper follow up care.   Clent Demark Keiko Myricks M.D. Diseases & Surgery of the Retina and Vitreous Retina & Diabetic [place] 04/02/22     Abbreviations: M myopia (nearsighted); A astigmatism; H hyperopia (farsighted); P presbyopia; Mrx spectacle prescription;  CTL contact lenses; OD right eye; OS left eye; OU both eyes  XT exotropia; ET esotropia; PEK punctate epithelial keratitis; PEE punctate epithelial erosions; DES dry eye syndrome; MGD meibomian gland dysfunction; ATs artificial tears; PFAT's preservative free artificial tears; [place] nuclear sclerotic cataract; PSC posterior subcapsular cataract; ERM epi-retinal membrane; PVD posterior vitreous detachment; RD retinal detachment; DM diabetes mellitus; DR diabetic retinopathy; NPDR non-proliferative diabetic retinopathy; PDR proliferative diabetic retinopathy; CSME clinically significant macular edema; DME diabetic macular edema; dbh dot blot hemorrhages; CWS cotton wool spot; POAG primary open angle glaucoma; C/D cup-to-disc ratio; HVF humphrey visual field; GVF goldmann visual field; OCT optical coherence tomography; IOP intraocular pressure; BRVO Branch retinal vein occlusion; CRVO central retinal vein occlusion; CRAO central retinal artery occlusion; BRAO branch retinal artery occlusion; RT retinal tear; SB scleral buckle; PPV pars plana vitrectomy; VH Vitreous hemorrhage; PRP  panretinal laser photocoagulation; IVK intravitreal kenalog; VMT vitreomacular traction; MH Macular hole;  NVD neovascularization of the disc; NVE neovascularization elsewhere; AREDS age related eye disease study; ARMD age related macular degeneration; POAG primary open angle glaucoma; EBMD epithelial/anterior basement membrane dystrophy; ACIOL anterior chamber intraocular lens; IOL intraocular lens; PCIOL posterior chamber intraocular lens; Phaco/IOL phacoemulsification with intraocular lens placement; [place] photorefractive keratectomy; LASIK laser assisted in situ keratomileusis; HTN hypertension; DM diabetes mellitus; COPD chronic obstructive pulmonary disease

## 2022-04-02 NOTE — Assessment & Plan Note (Signed)
Postop day #1 looks great.  Patient to commence topical medications 1 drop of each 4 times daily as instructed

## 2022-04-02 NOTE — Patient Instructions (Signed)
Ofloxacin  4 times daily to the operative eye  Prednisolone acetate 1 drop to the operative eye 4 times daily  Patient instructed not to refill the medications and use them for maximum of 3 weeks.  Patient instructed do not rub the eye.  Patient has the option to use the patch at night. Ofloxacin  4 times daily to the operative eye  Prednisolone acetate 1 drop to the operative eye 4 times daily  Patient instructed not to refill the medications and use them for maximum of 3 weeks.  Patient instructed do not rub the eye.  Patient has the option to use the patch at night.Ofloxacin  4 times daily to the operative eye  Prednisolone acetate 1 drop to the operative eye 4 times daily  Patient instructed not to refill the medications and use them for maximum of 3 weeks.  Patient instructed do not rub the eye.  Patient has the option to use the patch at night.No lifting and bending for 1 week. No water IN the eye for 10 days. Do not rub the eye. Wear shield at night for 1-3 days.  Continue your topical medications for a total of 3 weeks.  Do not refill your postoperative medications unless instructed.  Refrain from exercise or intentional activity which increases our heart rate above resting levels.  Normal walking to complete normal activities of your day are appropriate.  Driving:  Legally, you only need one good eye, of 20/40 or better to drive.  However, the practice does not recommend driving during first weeks after surgery, IF you are uncomfortable with your visual functioning or capabilities.   If you have known sleep apnea, wear your CPAP as you normally should.

## 2022-04-09 ENCOUNTER — Ambulatory Visit (INDEPENDENT_AMBULATORY_CARE_PROVIDER_SITE_OTHER): Payer: Medicare Other | Admitting: Ophthalmology

## 2022-04-09 ENCOUNTER — Encounter (INDEPENDENT_AMBULATORY_CARE_PROVIDER_SITE_OTHER): Payer: Self-pay | Admitting: Ophthalmology

## 2022-04-09 DIAGNOSIS — H35371 Puckering of macula, right eye: Secondary | ICD-10-CM

## 2022-04-09 NOTE — Assessment & Plan Note (Signed)
1 week post vitrectomy membrane peel, overall macular improving.  Visual acuity slightly improved.  Likely improvement will continue over the coming weeks to months.  Okay to have new refraction assessed or measured in 6 weeks or thereafter

## 2022-04-09 NOTE — Patient Instructions (Signed)
Ofloxacin  4 times daily to the operative eye  Prednisolone acetate 1 drop to the operative eye 4 times daily  Patient instructed not to refill the medications and use them for maximum of 3 weeks.  Patient instructed do not rub the eye.  Patient has the option to use the patch at night. Ofloxacin  4 times daily to the operative eye  Prednisolone acetate 1 drop to the operative eye 4 times daily  Patient instructed not to refill the medications and use them for maximum of 3 weeks.  Patient instructed do not rub the eye.  Patient has the option to use the patch at night.Ofloxacin  4 times daily to the operative eye  Prednisolone acetate 1 drop to the operative eye 4 times daily  Patient instructed not to refill the medications and use them for maximum of 3 weeks.  Patient instructed do not rub the eye.  Patient has the option to use the patch at night.Ofloxacin  4 times daily to the operative eye  Prednisolone acetate 1 drop to the operative eye 4 times daily  Patient instructed not to refill the medications and use them for maximum of 3 weeks.  Patient instructed do not rub the eye.  Patient has the option to use the patch at night.Ofloxacin  4 times daily to the operative eye  Prednisolone acetate 1 drop to the operative eye 4 times daily  Patient instructed not to refill the medications and use them for maximum of 3 weeks.  Patient instructed do not rub the eye.  Patient has the option to use the patch at night.  Patient may return to full activity in 3 days.

## 2022-04-09 NOTE — Progress Notes (Signed)
04/09/2022     CHIEF COMPLAINT Patient presents for  Chief Complaint  Patient presents with   Post-op Follow-up      HISTORY OF PRESENT ILLNESS: Jon Wallace is a 76 y.o. male who presents to the clinic today for:   HPI     Post-op Follow-up           Laterality: right eye         Comments   1 week dilate od post op oct Pt states his vision has been stable Pt states he has a new floater in his eye  Pt denies FOL      Last edited by Hurman Horn, MD on 04/09/2022  8:32 AM.      Referring physician: [facility] @ [address] [place],  [place] [address]  HISTORICAL INFORMATION:   Selected notes from the [facility]: Current Outpatient Medications (Ophthalmic Drugs)  Medication Sig   ofloxacin (OCUFLOX) 0.3 % ophthalmic solution Place 1 drop into the right eye 4 (four) times daily for 21 days.   prednisoLONE acetate (PRED FORTE) 1 % ophthalmic suspension Place 1 drop into the right eye 4 (four) times daily for 21 days.   No current facility-administered medications for this visit. (Ophthalmic Drugs)   Current Outpatient Medications (Other)  Medication Sig   docusate sodium (STOOL SOFTENER) 100 MG capsule Take 100 mg by mouth 2 (two) times daily.   polyethylene glycol (MIRALAX / GLYCOLAX) 17 g packet Take 17 g by mouth daily as needed for mild constipation or moderate constipation.   traMADol-acetaminophen (ULTRACET) 37.5-325 MG tablet Take 1 tablet by mouth every 8 (eight) hours as needed.   No current facility-administered medications for this visit. (Other)      REVIEW OF SYSTEMS: ROS   Negative for: Constitutional, Gastrointestinal, Neurological, Skin, Genitourinary, Musculoskeletal, HENT, Endocrine, Cardiovascular, Eyes, Respiratory, Psychiatric, Allergic/Imm, Heme/Lymph Last edited by Orene Desanctis D, CMA on 04/09/2022  8:21 AM.       ALLERGIES Allergies  Allergen Reactions    Morphine And Related Nausea And Vomiting    PAST MEDICAL HISTORY Past Medical History:  Diagnosis Date   Abdominal hernia    Cancer ([place])    Colon 2003 and Lung 2011   Past Surgical History:  Procedure Laterality Date   COLON SURGERY     COLOSTOMY     KNEE ARTHROSCOPY W/ MENISCAL REPAIR Left     FAMILY HISTORY Family History  Problem Relation Age of Onset   COPD Father    Throat cancer Father    Thyroid cancer Sister        and jaw cancer   Colon cancer Paternal Uncle    Stomach cancer Cousin    Pancreatic cancer Neg Hx     SOCIAL HISTORY Social History   Tobacco Use   Smoking status: Former   Smokeless tobacco: Never  Scientific laboratory technician Use: Never used  Substance Use Topics   Alcohol use: Not Currently   Drug use: Not Currently         OPHTHALMIC EXAM:  Base Eye Exam     Visual Acuity (ETDRS)       Right Left   Dist cc 20/125 -1 20/20 -1    Correction: Glasses         Tonometry (Tonopen, 8:26 AM)       Right Left   Pressure 6 8  Pupils       Pupils APD   Right PERRL None   Left PERRL None         Visual Fields       Left Right    Full Full         Extraocular Movement       Right Left    Full, Ortho Full, Ortho         Neuro/Psych     Oriented x3: Yes   Mood/Affect: Normal         Dilation     Right eye: 2.5% Phenylephrine, 1.0% Mydriacyl @ 8:26 AM           Slit Lamp and Fundus Exam     External Exam       Right Left   External Normal Normal         Slit Lamp Exam       Right Left   Lids/Lashes Normal Normal   Conjunctiva/Sclera White and quiet White and quiet   Cornea Clear Clear   Anterior Chamber Deep and quiet Deep and quiet   Iris dilated Round and reactive   Lens Centered posterior chamber intraocular lens 2+ Nuclear sclerosis   Anterior Vitreous Normal Normal         Fundus Exam       Right Left   Posterior Vitreous clear, avitric    Disc Normal    C/D Ratio  0.45    Macula Post ILM removal changes macula flat    Vessels Normal    Periphery Normal, attached             IMAGING AND PROCEDURES  Imaging and Procedures for 04/09/22  OCT, Retina - OU - Both Eyes       Right Eye Quality was good. Scan locations included subfoveal. Central Foveal Thickness: 494. Progression has worsened. Findings include abnormal foveal contour.   Left Eye Quality was good. Scan locations included subfoveal. Central Foveal Thickness: 293. Progression has been stable. Findings include normal foveal contour.   Notes Moderate to severe epiretinal membrane right eye with thickening however no CME, now 1 week post vitrectomy membrane peel.  Improving condition.  Thickening stable.  Will continue to monitor.              ASSESSMENT/PLAN:  Right epiretinal membrane 1 week post vitrectomy membrane peel, overall macular improving.  Visual acuity slightly improved.  Likely improvement will continue over the coming weeks to months.  Okay to have new refraction assessed or measured in 6 weeks or thereafter     ICD-10-CM   1. Right epiretinal membrane  H35.371 OCT, Retina - OU - Both Eyes      1.  2.  3.  Ophthalmic Meds Ordered this visit:  No orders of the defined types were placed in this encounter.      Return in about 10 weeks (around 06/18/2022) for dilate, OD, OCT, POST OP.  Patient Instructions  Ofloxacin  4 times daily to the operative eye  Prednisolone acetate 1 drop to the operative eye 4 times daily  Patient instructed not to refill the medications and use them for maximum of 3 weeks.  Patient instructed do not rub the eye.  Patient has the option to use the patch at night. Ofloxacin  4 times daily to the operative eye  Prednisolone acetate 1 drop to the operative eye 4 times daily  Patient instructed not to refill the medications and use them  for maximum of 3 weeks.  Patient instructed do not rub the eye.  Patient has the  option to use the patch at night.Ofloxacin  4 times daily to the operative eye  Prednisolone acetate 1 drop to the operative eye 4 times daily  Patient instructed not to refill the medications and use them for maximum of 3 weeks.  Patient instructed do not rub the eye.  Patient has the option to use the patch at night.Ofloxacin  4 times daily to the operative eye  Prednisolone acetate 1 drop to the operative eye 4 times daily  Patient instructed not to refill the medications and use them for maximum of 3 weeks.  Patient instructed do not rub the eye.  Patient has the option to use the patch at night.Ofloxacin  4 times daily to the operative eye  Prednisolone acetate 1 drop to the operative eye 4 times daily  Patient instructed not to refill the medications and use them for maximum of 3 weeks.  Patient instructed do not rub the eye.  Patient has the option to use the patch at night.  Patient may return to full activity in 3 days.   Explained the diagnoses, plan, and follow up with the patient and they expressed understanding.  Patient expressed understanding of the importance of proper follow up care.   Clent Demark Timonthy Hovater M.D. Diseases & Surgery of the Retina and Vitreous [facility] [place] 04/09/22     Abbreviations: M myopia (nearsighted); A astigmatism; H hyperopia (farsighted); P presbyopia; Mrx spectacle prescription;  CTL contact lenses; OD right eye; OS left eye; OU both eyes  XT exotropia; ET esotropia; PEK punctate epithelial keratitis; PEE punctate epithelial erosions; DES dry eye syndrome; MGD meibomian gland dysfunction; ATs artificial tears; PFAT's preservative free artificial tears; [place] nuclear sclerotic cataract; PSC posterior subcapsular cataract; ERM epi-retinal membrane; PVD posterior vitreous detachment; RD retinal detachment; DM diabetes mellitus; DR diabetic retinopathy; NPDR non-proliferative diabetic retinopathy; PDR proliferative diabetic retinopathy;  CSME clinically significant macular edema; DME diabetic macular edema; dbh dot blot hemorrhages; CWS cotton wool spot; POAG primary open angle glaucoma; C/D cup-to-disc ratio; HVF humphrey visual field; GVF goldmann visual field; OCT optical coherence tomography; IOP intraocular pressure; BRVO Branch retinal vein occlusion; CRVO central retinal vein occlusion; CRAO central retinal artery occlusion; BRAO branch retinal artery occlusion; RT retinal tear; SB scleral buckle; PPV pars plana vitrectomy; VH Vitreous hemorrhage; PRP panretinal laser photocoagulation; IVK intravitreal kenalog; VMT vitreomacular traction; MH Macular hole;  NVD neovascularization of the disc; NVE neovascularization elsewhere; AREDS age related eye disease study; ARMD age related macular degeneration; POAG primary open angle glaucoma; EBMD epithelial/anterior basement membrane dystrophy; ACIOL anterior chamber intraocular lens; IOL intraocular lens; PCIOL posterior chamber intraocular lens; Phaco/IOL phacoemulsification with intraocular lens placement; [place] photorefractive keratectomy; LASIK laser assisted in situ keratomileusis; HTN hypertension; DM diabetes mellitus; COPD chronic obstructive pulmonary disease

## 2022-05-07 ENCOUNTER — Telehealth: Payer: Self-pay | Admitting: Gastroenterology

## 2022-05-18 NOTE — Telephone Encounter (Signed)
Spoke to patient and sent a MR to [facility] and told patient to call in 2 weeks if he haven't heard anything from me.  Phone number [phone] Fax number [phone]

## 2022-05-20 NOTE — Telephone Encounter (Signed)
Records given to Dr.

## 2022-05-27 NOTE — Telephone Encounter (Signed)
Previous colonoscopy from [place] reviewed.  Exam date 12/10/2020. History of rectal cancer s/p LPR. Colonoscopy performed through the sigmoid colostomy and advanced to cecum. 6 mm sessile polyp at splenic flexure s/p polypectomy. Per patient was recommended to repeat in 1 year Biopsies-tubular adenoma  Proceed with colonoscopy through the stoma  Report sent for scanning RG

## 2022-05-28 NOTE — Telephone Encounter (Signed)
Previsit made for 06-04-2022 and procedure for 06-22-2022 and patient is ok with this

## 2022-06-04 ENCOUNTER — Ambulatory Visit (AMBULATORY_SURGERY_CENTER): Payer: Self-pay

## 2022-06-04 VITALS — Ht 69.0 in | Wt 145.0 lb

## 2022-06-04 DIAGNOSIS — Z85038 Personal history of other malignant neoplasm of large intestine: Secondary | ICD-10-CM

## 2022-06-04 NOTE — Progress Notes (Signed)
No egg or soy allergy known to patient  No issues known to pt with past sedation with any surgeries or procedures Patient denies ever being told they had issues or difficulty with intubation  No FH of Malignant Hyperthermia Pt is not on diet pills Pt is not on home 02  Pt is not on blood thinners  Pt reports issues with constipation --uses daily PRN Colace and Miralax-- uses permanent colostomy- has blockages due to colon anatomy- patient reports he irrigates colostomy regularly to avoid constipation; No A fib or A flutter Have any cardiac testing pending--NO Pt instructed to use [email or web address] or GoodRx for a price reduction on prep   Insurance verified during [facility] appt=Medicare A/B  Patient's chart reviewed by Osvaldo Angst CNRA prior to previsit and patient appropriate for the [facility].  Previsit completed and red dot placed by patient's name on their procedure day (on provider's schedule).

## 2022-06-18 ENCOUNTER — Encounter (INDEPENDENT_AMBULATORY_CARE_PROVIDER_SITE_OTHER): Payer: Medicare Other | Admitting: Ophthalmology

## 2022-06-18 DIAGNOSIS — H35371 Puckering of macula, right eye: Secondary | ICD-10-CM | POA: Diagnosis not present

## 2022-06-22 ENCOUNTER — Encounter: Payer: Medicare Other | Admitting: Gastroenterology

## 2022-07-23 ENCOUNTER — Encounter: Payer: Self-pay | Admitting: Gastroenterology

## 2022-07-23 ENCOUNTER — Ambulatory Visit (AMBULATORY_SURGERY_CENTER): Payer: Medicare Other | Admitting: Gastroenterology

## 2022-07-23 VITALS — BP 107/74 | HR 64 | Temp 97.3°F | Resp 11 | Ht 69.0 in | Wt 145.0 lb

## 2022-07-23 DIAGNOSIS — Z08 Encounter for follow-up examination after completed treatment for malignant neoplasm: Secondary | ICD-10-CM

## 2022-07-23 DIAGNOSIS — Z85038 Personal history of other malignant neoplasm of large intestine: Secondary | ICD-10-CM | POA: Diagnosis not present

## 2022-07-23 DIAGNOSIS — Z86010 Personal history of colonic polyps: Secondary | ICD-10-CM

## 2022-07-23 DIAGNOSIS — D123 Benign neoplasm of transverse colon: Secondary | ICD-10-CM

## 2022-07-23 DIAGNOSIS — Z09 Encounter for follow-up examination after completed treatment for conditions other than malignant neoplasm: Secondary | ICD-10-CM

## 2022-07-23 MED ORDER — SODIUM CHLORIDE 0.9 % IV SOLN: 500.0000 mL | INTRAVENOUS | Status: DC

## 2022-07-23 NOTE — Op Note (Signed)
[facility] Patient Name: Jon Wallace Procedure Date: 07/23/2022 8:27 AM MRN: [id number] Endoscopist: Jackquline Denmark , MD, [id number] Age: 76 Referring MD:  Date of Birth: 1946/05/20 Gender: Male Account #: [id number] Procedure:                Colonoscopy Indications:              High risk colon cancer surveillance: 1. Personal                            history of colonic polyps. 2. H/O Rectal Ca s/p                            neoadjuvant chemo-XRT/APR 2003 followed by                            pulmonary isolated met s/p wedge resection 2011.                            Neg colon 12/2020. Medicines:                Monitored Anesthesia Care Procedure:                Pre-Anesthesia Assessment:                           - Prior to the procedure, a History and Physical                            was performed, and patient medications and                            allergies were reviewed. The patient's tolerance of                            previous anesthesia was also reviewed. The risks                            and benefits of the procedure and the sedation                            options and risks were discussed with the patient.                            All questions were answered, and informed consent                            was obtained. Prior Anticoagulants: The patient has                            taken no anticoagulant or antiplatelet agents. ASA                            Grade Assessment: III - A patient with severe  systemic disease. After reviewing the risks and                            benefits, the patient was deemed in satisfactory                            condition to undergo the procedure.                           After obtaining informed consent, the colonoscope                            was passed under direct vision. Throughout the                            procedure, the patient's blood pressure, pulse, and                             oxygen saturations were monitored continuously. The                            PCF-HQ190L Colonoscope was introduced through the                            stoma and advanced to the the cecum, identified by                            appendiceal orifice and ileocecal valve. The                            colonoscopy was performed without difficulty. The                            patient tolerated the procedure well. The quality                            of the bowel preparation was good. The ileocecal                            valve, appendiceal orifice was photographed. Scope In: 8:40:05 AM Scope Out: 8:56:09 AM Scope Withdrawal Time: 0 hours 13 minutes 59 seconds  Total Procedure Duration: 0 hours 16 minutes 4 seconds  Findings:                 A 15 mm polyp was found in the hepatic flexure next                            to the tattoo. The polyp was sessile. The polyp was                            removed with a piecemeal technique using a hot                            snare. Resection  and retrieval were complete.                           A few small-mouthed diverticula were found in the                            ascending colon.                           The exam was otherwise without abnormality. There                            was mild erythema around the stoma without any                            prolapse. Small peristomal hernia. Complications:            No immediate complications. Estimated Blood Loss:     Estimated blood loss: none. Impression:               - One 15 mm polyp at the hepatic flexure (next to                            previously placed tattoo), removed piecemeal using                            a hot snare. Resected and retrieved.                           - Minimal ascending colon diverticulosis.                           - The examination was otherwise normal through the                            stoma. Recommendation:            - Patient has a contact number available for                            emergencies. The signs and symptoms of potential                            delayed complications were discussed with the                            patient. Return to normal activities tomorrow.                            Written discharge instructions were provided to the                            patient.                           - Resume previous diet.                           -  Continue present medications.                           - No aspirin, ibuprofen, naproxen, or other                            non-steroidal anti-inflammatory drugs for 5 days                            after polyp removal.                           - Await pathology results.                           - Repeat colonoscopy for surveillance based on                            pathology results.                           - The findings and recommendations were discussed                            with the patient's family. Jackquline Denmark, MD 07/23/2022 9:02:15 AM This report has been signed electronically.

## 2022-07-23 NOTE — Progress Notes (Signed)
Pt resting comfortably. VSS. Airway intact. SBAR complete to RN. All questions answered.   

## 2022-07-23 NOTE — Patient Instructions (Addendum)
Read all of the handouts given to you by your recovery room nurse.  No NSAIDS:ibuprofen, aspirin, aleve for 5 days.   Resume your other medications as ordered.  YOU HAD AN ENDOSCOPIC PROCEDURE TODAY AT [facility]:   Refer to the procedure report that was given to you for any specific questions about what was found during the examination.  If the procedure report does not answer your questions, please call your gastroenterologist to clarify.  If you requested that your care partner not be given the details of your procedure findings, then the procedure report has been included in a sealed envelope for you to review at your convenience later.  YOU SHOULD EXPECT: Some feelings of bloating in the abdomen. Passage of more gas than usual.  Walking can help get rid of the air that was put into your GI tract during the procedure and reduce the bloating. If you had a lower endoscopy (such as a colonoscopy or flexible sigmoidoscopy) you may notice spotting of blood in your stool or on the toilet paper. If you underwent a bowel prep for your procedure, you may not have a normal bowel movement for a few days.  Please Note:  You might notice some irritation and congestion in your nose or some drainage.  This is from the oxygen used during your procedure.  There is no need for concern and it should clear up in a day or so.  SYMPTOMS TO REPORT IMMEDIATELY:  Following lower endoscopy (colonoscopy or flexible sigmoidoscopy):  Excessive amounts of blood in the stool  Significant tenderness or worsening of abdominal pains  Swelling of the abdomen that is new, acute  Fever of 100F or higher    For urgent or emergent issues, a gastroenterologist can be reached at any hour by calling [phone]. Do not use MyChart messaging for urgent concerns.    DIET:  We do recommend a small meal at first, but then you may proceed to your regular diet.  Drink plenty of fluids but you should avoid alcoholic  beverages for 24 hours.  ACTIVITY:  You should plan to take it easy for the rest of today and you should NOT DRIVE or use heavy machinery until tomorrow (because of the sedation medicines used during the test).    FOLLOW UP: Our staff will call the number listed on your records the next business day following your procedure.  We will call around 7:15- 8:00 am to check on you and address any questions or concerns that you may have regarding the information given to you following your procedure. If we do not reach you, we will leave a message.     If any biopsies were taken you will be contacted by phone or by letter within the next 1-3 weeks.  Please call us at [phone] if you have not heard about the biopsies in 3 weeks.    SIGNATURES/CONFIDENTIALITY: You and/or your care partner have signed paperwork which will be entered into your electronic medical record.  These signatures attest to the fact that that the information above on your After Visit Summary has been reviewed and is understood.  Full responsibility of the confidentiality of this discharge information lies with you and/or your care-partner.

## 2022-07-23 NOTE — Progress Notes (Signed)
Chief Complaint: Recurrent abdominal pain  Referring Provider:  [place], [place];   #1. H/O polyps  #2. H/O Rectal Ca s/p neoadjuvant chemo-XRT/APR 2003 followed by pulmonary isolated met s/p wedge resection 2011. Neg colon 12/2020.   Plan:  Colon today  HPI:    Jon Wallace is a 76 y.o. male  With H/O Rectal Ca s/p neoadjuvant chemo-XRT/APR 2003 followed by pulmonary isolated met s/p wedge resection 2011   For colon today for H/O polyps.     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  Surgery consultation recommended watchful waiting.  Patient feels much better.  He denies having any N/V/diarrhea or abdominal pain currently.  Unfortunately, his sister has terminal illness.  He is heading to [place] state to meet her.  No significant weight loss.  [facility]. Was undergoing annual colonoscopies prior.  He has been eating better-mostly high-protein diet and has been walking 2 to 3 miles per day.  Pleased with the progress.  Wt Readings from Last 3 Encounters:  07/23/22 145 lb (65.8 kg)  06/04/22 145 lb (65.8 kg)  10/29/21 141 lb (64 kg)    From previous notes  Adm with PSBO 9/10-9/13/2022, CT as below.  Managed conservatively with NG/IVF/serial KUBs. Sx consultation was sought-recommended conservative management for now.  Continued intermittent obstruction since APR- with episodic generalized Abdo pain/N/V x 24-48hrs, then gets better the frequency of 1-2 times per month. He goes on a clear liquid diet, takes a warm bath and his symptoms do get better.  No melena or hematochezia.  He denies having any heartburn, N/V (except during above episodes), odynophagia or dysphagia.  No fever chills or night sweats.   Recent GI work-up:  CT AP 09/21/2021  with contrast 1. Small bowel loops in the mid abdomen are mildly distended up to 3.4 cm diameter. There is some transmural hyperenhancement of small bowel in the pelvis. A small bowel loop with fecalized contents is identified in the anterior pelvis that abruptly tapers into a nondilated loop the contains only gas but no enteric contents. This is in the same region as the hyperenhancing bowel seen on CT scan from 04/12/2021 and is just deep to the anterior peritoneal lining. Imaging features are compatible with a small bowel obstruction. Distal ileum beyond this point is decompressed. 2. Trace intraperitoneal free fluid. 3. 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  CT AP with contrast 04/12/2021 1. Status post abdominal perineal resection for rectal cancer. 2. Mid small bowel dilatation, mild, favoring partial small bowel obstruction, most likely secondary to adhesions. 3. Short segment area of underdistention and apparent wall thickening within the dilated small bowel could represent concurrent enteritis. Adjacent surrounding mesenteric edema is nonspecific. No specific evidence of complicating ischemia. 4. Trace pelvic fluid, likely secondary. 5. No findings of metastatic disease.  Last colonoscopy 12/2020 through stoma: neg for recurrence.  MRE 07/25/2021 IMPRESSION: 1. Evaluation of the small bowel is somewhat limited by motion artifact and underdistention. Within this limitation, no evidence of small-bowel obstruction or inflammatory findings. Given the motion limitations of this examination, CT enterography may be helpful for better evaluation if there is a high clinical suspicion for inflammatory bowel disease. 2. Unchanged postoperative/posttreatment appearance of the low pelvis status post abdominoperineal resection with left lower quadrant end  colostomy. No evidence of malignant recurrence or metastatic disease in the abdomen or pelvis.   Past Medical  History:  Diagnosis Date   Cataract    RIGHT eye sx   Colon cancer ([facility])    2003   Lung cancer ([facility])    2011    Past Surgical History:  Procedure Laterality Date   COLON SURGERY  2003   permanent colostomy   COLON SURGERY  2021   colon sx to correct scar tissue/blockages   COLOSTOMY  2003   permanent colostomy placed due to colon CA   INGUINAL HERNIA REPAIR Left 1951   KNEE ARTHROSCOPY W/ MENISCAL REPAIR Left    TONSILLECTOMY     WISDOM TOOTH EXTRACTION      Family History  Problem Relation Age of Onset   Colon polyps Father 110   COPD Father    Throat cancer Father 39   Throat cancer Sister 70   Thyroid cancer Sister 75       and jaw cancer   Colon cancer Paternal Uncle    Stomach cancer Cousin    Pancreatic cancer Neg Hx     Social History   Tobacco Use   Smoking status: Former   Smokeless tobacco: Never  Scientific laboratory technician Use: Never used  Substance Use Topics   Alcohol use: Not Currently    Alcohol/week: 0.0 - 7.0 standard drinks of alcohol   Drug use: Not Currently    Current Outpatient Medications  Medication Sig Dispense Refill   docusate sodium (STOOL SOFTENER) 100 MG capsule Take 100 mg by mouth 2 (two) times daily as needed for mild constipation.     polyethylene glycol (MIRALAX / GLYCOLAX) 17 g packet Take 17 g by mouth daily as needed for mild constipation or moderate constipation. (Patient not taking: Reported on 07/23/2022) 14 each 0   traMADol-acetaminophen (ULTRACET) 37.5-325 MG tablet Take 1 tablet by mouth every 8 (eight) hours as needed. (Patient not taking: Reported on 06/04/2022) 20 tablet 0   Current Facility-Administered Medications  Medication Dose Route Frequency Provider Last Rate Last Admin   0.9 %  sodium chloride infusion  500 mL Intravenous Continuous Jackquline Denmark, MD        Allergies  Allergen Reactions   Morphine And Related Nausea And Vomiting    Review of Systems:  neg     Physical Exam:    BP 106/67   Pulse  (!) 104   Temp (!) 97.3 F (36.3 C)   Ht _0  (1.753 m)   Wt 145 lb (65.8 kg)   SpO2 99%   BMI 21.41 kg/m  Wt Readings from Last 3 Encounters:  07/23/22 145 lb (65.8 kg)  06/04/22 145 lb (65.8 kg)  10/29/21 141 lb (64 kg)   Constitutional:  Well-developed, in no acute distress. Psychiatric: Normal mood and affect. Behavior is normal. HEENT: Pupils normal.  Conjunctivae are normal. No scleral icterus. Cardiovascular: Normal rate, regular rhythm. No edema Pulmonary/chest: Effort normal and breath sounds normal. No wheezing, rales or rhonchi. Abdominal: Soft, nondistended. Nontender. Bowel sounds active throughout. There are no masses palpable. No hepatomegaly.  Colostomy with hernia.  Well-healed surgical scars. Rectal: Deferred Neurological: Alert and oriented to person place and time. Skin: Skin is warm and dry. No rashes noted.  Data Reviewed: I have personally reviewed following labs and imaging studies  CBC:    Latest Ref Rng & Units 09/23/2021    4:19 AM 09/22/2021    4:15 AM  09/21/2021   10:04 AM  CBC  WBC 4.0 - 10.5 K/uL 7.1  8.3  9.6   Hemoglobin 13.0 - 17.0 g/dL 10.9  11.3  12.6   Hematocrit 39.0 - 52.0 % 33.9  34.4  38.1   Platelets 150 - 400 K/uL 416  399  475     CMP:    Latest Ref Rng & Units 09/23/2021    4:19 AM 09/22/2021    4:15 AM 09/21/2021   10:04 AM  CMP  Glucose 70 - 99 mg/dL 84  100  118   BUN 8 - 23 mg/dL _0 Creatinine 0.61 - 1.24 mg/dL 0.98  0.96  1.06   Sodium 135 - 145 mmol/L 137  137  135   Potassium 3.5 - 5.1 mmol/L 3.6  3.8  4.1   Chloride 98 - 111 mmol/L 108  106  102   CO2 22 - 32 mmol/L _1 Calcium 8.9 - 10.3 mg/dL 8.0  8.0  8.6   Total Protein 6.5 - 8.1 g/dL 6.1  6.2  7.3   Total Bilirubin 0.3 - 1.2 mg/dL 0.5  0.7  0.6   Alkaline Phos 38 - 126 U/L 37  38  40   AST 15 - 41 U/L _2 ALT 0 - 44 U/L _3 Carmell Austria, MD 07/23/2022, 8:30 AM  Cc: [facility]*

## 2022-07-23 NOTE — Progress Notes (Signed)
Pt's states no medical or surgical changes since previsit or office visit. 

## 2022-07-24 ENCOUNTER — Telehealth: Payer: Self-pay | Admitting: *Deleted

## 2022-07-24 NOTE — Telephone Encounter (Signed)
  Follow up Call-     07/23/2022    7:38 AM  Call back number  Post procedure Call Back phone  # [phone]  Permission to leave phone message Yes     Patient questions:  Do you have a fever, pain , or abdominal swelling? No. Pain Score  0 *  Have you tolerated food without any problems? Yes.    Have you been able to return to your normal activities? Yes.    Do you have any questions about your discharge instructions: Diet   No. Medications  No. Follow up visit  No.  Do you have questions or concerns about your Care? No.  Actions: * If pain score is 4 or above: No action needed, pain <4.

## 2022-07-28 ENCOUNTER — Encounter: Payer: Self-pay | Admitting: Gastroenterology

## 2022-08-31 DIAGNOSIS — Z961 Presence of intraocular lens: Secondary | ICD-10-CM | POA: Diagnosis not present

## 2022-08-31 DIAGNOSIS — H43813 Vitreous degeneration, bilateral: Secondary | ICD-10-CM | POA: Diagnosis not present

## 2022-08-31 DIAGNOSIS — H353121 Nonexudative age-related macular degeneration, left eye, early dry stage: Secondary | ICD-10-CM | POA: Diagnosis not present

## 2022-08-31 DIAGNOSIS — H10413 Chronic giant papillary conjunctivitis, bilateral: Secondary | ICD-10-CM | POA: Diagnosis not present

## 2022-08-31 DIAGNOSIS — H2512 Age-related nuclear cataract, left eye: Secondary | ICD-10-CM | POA: Diagnosis not present

## 2022-08-31 DIAGNOSIS — H04123 Dry eye syndrome of bilateral lacrimal glands: Secondary | ICD-10-CM | POA: Diagnosis not present

## 2022-08-31 DIAGNOSIS — H43392 Other vitreous opacities, left eye: Secondary | ICD-10-CM | POA: Diagnosis not present

## 2022-09-17 DIAGNOSIS — Z0000 Encounter for general adult medical examination without abnormal findings: Secondary | ICD-10-CM | POA: Diagnosis not present

## 2022-09-17 DIAGNOSIS — Z6821 Body mass index (BMI) 21.0-21.9, adult: Secondary | ICD-10-CM | POA: Diagnosis not present

## 2022-09-22 ENCOUNTER — Encounter: Payer: Self-pay | Admitting: Gastroenterology

## 2022-09-22 DIAGNOSIS — Z933 Colostomy status: Secondary | ICD-10-CM | POA: Diagnosis not present

## 2022-09-22 DIAGNOSIS — E7800 Pure hypercholesterolemia, unspecified: Secondary | ICD-10-CM | POA: Diagnosis not present

## 2022-09-22 DIAGNOSIS — Z125 Encounter for screening for malignant neoplasm of prostate: Secondary | ICD-10-CM | POA: Diagnosis not present

## 2022-09-22 DIAGNOSIS — D500 Iron deficiency anemia secondary to blood loss (chronic): Secondary | ICD-10-CM | POA: Diagnosis not present

## 2022-09-22 DIAGNOSIS — E559 Vitamin D deficiency, unspecified: Secondary | ICD-10-CM | POA: Diagnosis not present

## 2022-09-22 DIAGNOSIS — K5901 Slow transit constipation: Secondary | ICD-10-CM | POA: Diagnosis not present

## 2022-09-22 DIAGNOSIS — Z9889 Other specified postprocedural states: Secondary | ICD-10-CM | POA: Diagnosis not present

## 2022-09-22 DIAGNOSIS — Z6821 Body mass index (BMI) 21.0-21.9, adult: Secondary | ICD-10-CM | POA: Diagnosis not present

## 2022-09-22 DIAGNOSIS — Z85048 Personal history of other malignant neoplasm of rectum, rectosigmoid junction, and anus: Secondary | ICD-10-CM | POA: Diagnosis not present

## 2022-09-22 DIAGNOSIS — K56699 Other intestinal obstruction unspecified as to partial versus complete obstruction: Secondary | ICD-10-CM | POA: Diagnosis not present

## 2022-09-26 ENCOUNTER — Other Ambulatory Visit: Payer: Self-pay | Admitting: Gastroenterology

## 2022-09-28 ENCOUNTER — Other Ambulatory Visit: Payer: Self-pay

## 2022-09-28 DIAGNOSIS — K56600 Partial intestinal obstruction, unspecified as to cause: Secondary | ICD-10-CM

## 2022-09-28 DIAGNOSIS — R109 Unspecified abdominal pain: Secondary | ICD-10-CM

## 2022-09-28 MED ORDER — TRAMADOL-ACETAMINOPHEN 37.5-325 MG PO TABS: 1.0000 | ORAL_TABLET | Freq: Three times a day (TID) | ORAL | 0 refills | Status: DC | PRN

## 2022-09-28 NOTE — Telephone Encounter (Signed)
Ultracet 37.5/325 1 tab po Q8hrs prn #30 (as before).  For next prescription please get in touch with PCP RG

## 2022-10-08 DIAGNOSIS — U071 COVID-19: Secondary | ICD-10-CM | POA: Diagnosis not present

## 2023-01-16 ENCOUNTER — Emergency Department (HOSPITAL_COMMUNITY): Payer: Medicare Other

## 2023-01-16 ENCOUNTER — Inpatient Hospital Stay (HOSPITAL_COMMUNITY)
Admission: EM | Admit: 2023-01-16 | Discharge: 2023-01-18 | DRG: 389 | Disposition: A | Discharge status: Home / Self Care | Payer: Medicare Other | Attending: Internal Medicine | Admitting: Internal Medicine

## 2023-01-16 ENCOUNTER — Other Ambulatory Visit: Payer: Self-pay

## 2023-01-16 ENCOUNTER — Encounter (HOSPITAL_COMMUNITY): Payer: Self-pay

## 2023-01-16 DIAGNOSIS — E871 Hypo-osmolality and hyponatremia: Secondary | ICD-10-CM | POA: Diagnosis not present

## 2023-01-16 DIAGNOSIS — N281 Cyst of kidney, acquired: Secondary | ICD-10-CM | POA: Diagnosis not present

## 2023-01-16 DIAGNOSIS — K56600 Partial intestinal obstruction, unspecified as to cause: Secondary | ICD-10-CM | POA: Diagnosis not present

## 2023-01-16 DIAGNOSIS — Z885 Allergy status to narcotic agent status: Secondary | ICD-10-CM | POA: Diagnosis not present

## 2023-01-16 DIAGNOSIS — Z808 Family history of malignant neoplasm of other organs or systems: Secondary | ICD-10-CM

## 2023-01-16 DIAGNOSIS — Z933 Colostomy status: Secondary | ICD-10-CM | POA: Diagnosis not present

## 2023-01-16 DIAGNOSIS — Z9049 Acquired absence of other specified parts of digestive tract: Secondary | ICD-10-CM

## 2023-01-16 DIAGNOSIS — R1111 Vomiting without nausea: Secondary | ICD-10-CM | POA: Diagnosis not present

## 2023-01-16 DIAGNOSIS — K56609 Unspecified intestinal obstruction, unspecified as to partial versus complete obstruction: Secondary | ICD-10-CM | POA: Diagnosis not present

## 2023-01-16 DIAGNOSIS — K449 Diaphragmatic hernia without obstruction or gangrene: Secondary | ICD-10-CM | POA: Diagnosis present

## 2023-01-16 DIAGNOSIS — R9431 Abnormal electrocardiogram [ECG] [EKG]: Secondary | ICD-10-CM | POA: Diagnosis not present

## 2023-01-16 DIAGNOSIS — R188 Other ascites: Secondary | ICD-10-CM | POA: Diagnosis not present

## 2023-01-16 DIAGNOSIS — Z800 Family history of malignant neoplasm of digestive organs: Secondary | ICD-10-CM

## 2023-01-16 DIAGNOSIS — K5669 Other partial intestinal obstruction: Secondary | ICD-10-CM | POA: Diagnosis not present

## 2023-01-16 DIAGNOSIS — D649 Anemia, unspecified: Secondary | ICD-10-CM | POA: Diagnosis present

## 2023-01-16 DIAGNOSIS — Z85118 Personal history of other malignant neoplasm of bronchus and lung: Secondary | ICD-10-CM

## 2023-01-16 DIAGNOSIS — Z83719 Family history of colon polyps, unspecified: Secondary | ICD-10-CM

## 2023-01-16 DIAGNOSIS — M47816 Spondylosis without myelopathy or radiculopathy, lumbar region: Secondary | ICD-10-CM | POA: Diagnosis not present

## 2023-01-16 DIAGNOSIS — Z85048 Personal history of other malignant neoplasm of rectum, rectosigmoid junction, and anus: Secondary | ICD-10-CM

## 2023-01-16 DIAGNOSIS — Z79899 Other long term (current) drug therapy: Secondary | ICD-10-CM

## 2023-01-16 DIAGNOSIS — Z87891 Personal history of nicotine dependence: Secondary | ICD-10-CM

## 2023-01-16 DIAGNOSIS — K5651 Intestinal adhesions [bands], with partial obstruction: Secondary | ICD-10-CM | POA: Diagnosis present

## 2023-01-16 DIAGNOSIS — R110 Nausea: Secondary | ICD-10-CM | POA: Diagnosis not present

## 2023-01-16 DIAGNOSIS — Z825 Family history of asthma and other chronic lower respiratory diseases: Secondary | ICD-10-CM | POA: Diagnosis not present

## 2023-01-16 DIAGNOSIS — R112 Nausea with vomiting, unspecified: Secondary | ICD-10-CM | POA: Diagnosis not present

## 2023-01-16 DIAGNOSIS — R1030 Lower abdominal pain, unspecified: Secondary | ICD-10-CM | POA: Diagnosis not present

## 2023-01-16 LAB — CBC WITH DIFFERENTIAL/PLATELET
Abs Immature Granulocytes: 0.03 10*3/uL (ref 0.00–0.07)
Basophils Absolute: 0 10*3/uL (ref 0.0–0.1)
Basophils Relative: 1 %
Eosinophils Absolute: 0 10*3/uL (ref 0.0–0.5)
Eosinophils Relative: 0 %
HCT: 36.1 % — ABNORMAL LOW (ref 39.0–52.0)
Hemoglobin: 12.1 g/dL — ABNORMAL LOW (ref 13.0–17.0)
Immature Granulocytes: 0 %
Lymphocytes Relative: 9 %
Lymphs Abs: 0.7 10*3/uL (ref 0.7–4.0)
MCH: 30 pg (ref 26.0–34.0)
MCHC: 33.5 g/dL (ref 30.0–36.0)
MCV: 89.4 fL (ref 80.0–100.0)
Monocytes Absolute: 0.6 10*3/uL (ref 0.1–1.0)
Monocytes Relative: 7 %
Neutro Abs: 7.2 10*3/uL (ref 1.7–7.7)
Neutrophils Relative %: 83 %
Platelets: 388 10*3/uL (ref 150–400)
RBC: 4.04 MIL/uL — ABNORMAL LOW (ref 4.22–5.81)
RDW: 14 % (ref 11.5–15.5)
WBC: 8.7 10*3/uL (ref 4.0–10.5)
nRBC: 0 % (ref 0.0–0.2)

## 2023-01-16 LAB — URINALYSIS, W/ REFLEX TO CULTURE (INFECTION SUSPECTED)
Bilirubin Urine: NEGATIVE
Glucose, UA: NEGATIVE mg/dL
Hgb urine dipstick: NEGATIVE
Ketones, ur: 20 mg/dL — AB
Leukocytes,Ua: NEGATIVE
Nitrite: NEGATIVE
Protein, ur: NEGATIVE mg/dL
Specific Gravity, Urine: 1.025 (ref 1.005–1.030)
pH: 6 (ref 5.0–8.0)

## 2023-01-16 LAB — COMPREHENSIVE METABOLIC PANEL
ALT: 18 U/L (ref 0–44)
AST: 28 U/L (ref 15–41)
Albumin: 3.9 g/dL (ref 3.5–5.0)
Alkaline Phosphatase: 38 U/L (ref 38–126)
Anion gap: 11 (ref 5–15)
BUN: 23 mg/dL (ref 8–23)
CO2: 23 mmol/L (ref 22–32)
Calcium: 8.8 mg/dL — ABNORMAL LOW (ref 8.9–10.3)
Chloride: 100 mmol/L (ref 98–111)
Creatinine, Ser: 1.11 mg/dL (ref 0.61–1.24)
GFR, Estimated: >60 mL/min (ref >60)
Glucose, Bld: 106 mg/dL — ABNORMAL HIGH (ref 70–99)
Potassium: 3.9 mmol/L (ref 3.5–5.1)
Sodium: 134 mmol/L — ABNORMAL LOW (ref 135–145)
Total Bilirubin: 1.1 mg/dL (ref 0.3–1.2)
Total Protein: 6.9 g/dL (ref 6.5–8.1)

## 2023-01-16 LAB — LIPASE, BLOOD: Lipase: 31 U/L (ref 11–51)

## 2023-01-16 MED ORDER — HYDROMORPHONE HCL 1 MG/ML IJ SOLN: 0.5000 mg | INTRAMUSCULAR | Status: DC | PRN

## 2023-01-16 MED ORDER — ONDANSETRON HCL 4 MG PO TABS: 4.0000 mg | ORAL_TABLET | Freq: Four times a day (QID) | ORAL | Status: DC | PRN

## 2023-01-16 MED ORDER — FENTANYL CITRATE PF 50 MCG/ML IJ SOSY
50.0000 ug | PREFILLED_SYRINGE | Freq: Once | INTRAMUSCULAR | Status: AC
  Administered 2023-01-16: 50 ug via INTRAVENOUS
  Filled 2023-01-16: qty 1

## 2023-01-16 MED ORDER — SODIUM CHLORIDE 0.9 % IV BOLUS
1000.0000 mL | Freq: Once | INTRAVENOUS | Status: AC
  Administered 2023-01-16: 1000 mL via INTRAVENOUS

## 2023-01-16 MED ORDER — ACETAMINOPHEN 650 MG RE SUPP: 650.0000 mg | Freq: Four times a day (QID) | RECTAL | Status: DC | PRN

## 2023-01-16 MED ORDER — ACETAMINOPHEN 325 MG PO TABS: 650.0000 mg | ORAL_TABLET | Freq: Four times a day (QID) | ORAL | Status: DC | PRN

## 2023-01-16 MED ORDER — ONDANSETRON HCL 4 MG/2ML IJ SOLN: 4.0000 mg | Freq: Four times a day (QID) | INTRAMUSCULAR | Status: DC | PRN

## 2023-01-16 MED ORDER — PANTOPRAZOLE SODIUM 40 MG IV SOLR
40.0000 mg | INTRAVENOUS | Status: DC
  Administered 2023-01-16: 40 mg via INTRAVENOUS
  Filled 2023-01-16 (×2): qty 10

## 2023-01-16 MED ORDER — ENOXAPARIN SODIUM 40 MG/0.4ML IJ SOSY
40.0000 mg | PREFILLED_SYRINGE | INTRAMUSCULAR | Status: DC
  Administered 2023-01-16 – 2023-01-17 (×2): 40 mg via SUBCUTANEOUS
  Filled 2023-01-16 (×2): qty 0.4

## 2023-01-16 MED ORDER — ONDANSETRON HCL 4 MG/2ML IJ SOLN
4.0000 mg | Freq: Once | INTRAMUSCULAR | Status: AC
  Administered 2023-01-16: 4 mg via INTRAVENOUS
  Filled 2023-01-16: qty 2

## 2023-01-16 MED ORDER — IOHEXOL 350 MG/ML SOLN
100.0000 mL | Freq: Once | INTRAVENOUS | Status: AC | PRN
  Administered 2023-01-16: 100 mL via INTRAVENOUS

## 2023-01-16 MED ORDER — POTASSIUM CHLORIDE IN NACL 20-0.9 MEQ/L-% IV SOLN
INTRAVENOUS | Status: DC
  Filled 2023-01-16 (×3): qty 1000

## 2023-01-16 NOTE — Progress Notes (Signed)
Pt refusing NG tube at this time, states he hasn't vomited since 7am . . Dr Robb Matar made aware.

## 2023-01-16 NOTE — ED Triage Notes (Signed)
Pt coming from home via EMS with c/o lower abdominal pain and n/v since last night. Hx colon cancer. Pt states he gets these "episodes" every few months with his colon cancer.

## 2023-01-16 NOTE — H&P (Signed)
History and Physical    Patient: Jon Wallace ZOX:[id number] DOB: [birth year] DOA: 01/16/2023 DOS: the patient was seen and examined on 01/16/2023 PCP: [facility]  Patient coming from: Home  Chief Complaint:  Chief Complaint  Patient presents with   Abdominal Pain   Nausea   Emesis   HPI: Jon Wallace is a 77 y.o. 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  However, he has not vomited since 0700.  His last bowel movement was yesterday.  No  diarrhea, melena or hematochezia.  No flank pain, dysuria, frequency or hematuria. He denied fever, chills, rhinorrhea, sore throat, wheezing or hemoptysis.  No chest pain, palpitations, diaphoresis, PND, orthopnea or pitting edema of the lower extremities.    No polyuria, polydipsia, polyphagia or blurred vision.   Lab work: CBC showed a white count of 8.7, hemoglobin 12.1 g/dL platelets 811.  Lipase was 31 units/L.  CMP showed a sodium 134 mmol/L, glucose of 106 and calcium of 8.9 mg/dL.  The rest of the CMP measurements were normal.  Imaging: CT abdomen/pelvis with contrast show a high-grade partial small bowel obstruction in the lower abdomen/upper pelvis, most likely due to adhesions.  No hydronephrosis.  Minimal ascites.  No pneumoperitoneum.  Small hiatal hernia right renal cysts.  Atherosclerosis.  Lumbar spondylosis.   ED course: Initial vital signs were temperature 98.6 F, pulse 86, respiration 18, BP 138/92 mmHg O2 sat 100% on room air.  The patient received fentanyl 50 mcg IVP x 1 and ondansetron 4 mg IVP.  Review of Systems: As mentioned in the history of present illness. All other systems reviewed and are negative.  Past Medical History:   Diagnosis Date   Cataract    RIGHT eye sx   Colon cancer ([facility])    2003   Lung cancer ([facility])    2011   Past Surgical History:  Procedure Laterality Date   COLON SURGERY  2003   permanent colostomy   COLON SURGERY  2021   colon sx to correct scar tissue/blockages   COLOSTOMY  2003   permanent colostomy placed due to colon CA   INGUINAL HERNIA REPAIR Left 1951   KNEE ARTHROSCOPY W/ MENISCAL REPAIR Left    TONSILLECTOMY     WISDOM TOOTH EXTRACTION     Social History:  reports that he has quit smoking. He has never used smokeless tobacco. He reports that he does not currently use alcohol. He reports that he does not currently use drugs.  Allergies  Allergen Reactions   Morphine And Codeine Nausea And Vomiting    Family History  Problem Relation Age of Onset   Colon polyps Father 59   COPD Father    Throat cancer Father 51   Throat cancer Sister 55   Thyroid cancer Sister 80       and jaw cancer   Colon cancer Paternal Uncle    Stomach cancer Cousin    Pancreatic cancer Neg Hx     Prior to Admission medications   Medication Sig Start Date End Date Taking? Authorizing Provider  acetaminophen (TYLENOL) 325 MG tablet Take 325 mg by mouth every 6 (six) hours as needed for mild pain. 04/23/19  Yes [provider]  docusate sodium (STOOL SOFTENER)  100 MG capsule Take 100 mg by mouth 2 (two) times daily as needed for mild constipation.   Yes [provider]  traMADol-acetaminophen (ULTRACET) 37.5-325 MG tablet Take 1 tablet by mouth every 8 (eight) hours as needed. 09/28/22  Yes Lynann Bologna, MD  polyethylene glycol (MIRALAX / GLYCOLAX) 17 g packet Take 17 g by mouth daily as needed for mild constipation or moderate constipation. Patient not taking: Reported on 07/23/2022 09/23/21   Joycelyn Das, MD    Physical Exam: Vitals:   01/16/23 1051 01/16/23 1145 01/16/23 1151 01/16/23 1440  BP: (!) 138/92 119/80    Pulse: 86 74 75   Resp: 18 11 12    Temp: 98.6  F (37 C)   98.6 F (37 C)  TempSrc: Oral     SpO2: 100% 94% 95%   Weight: 65.8 kg     Height: 5\' 9"  (1.753 m)      Physical Exam Vitals and nursing note reviewed.  Constitutional:      General: He is awake. He is not in acute distress.    Appearance: He is well-developed.  HENT:     Head: Normocephalic.     Nose: No rhinorrhea.     Mouth/Throat:     Mouth: Mucous membranes are dry.  Eyes:     General: No scleral icterus.    Pupils: Pupils are equal, round, and reactive to light.  Neck:     Vascular: No JVD.  Cardiovascular:     Rate and Rhythm: Normal rate and regular rhythm.     Heart sounds: S1 normal and S2 normal.  Pulmonary:     Effort: Pulmonary effort is normal.     Breath sounds: Normal breath sounds. No wheezing, rhonchi or rales.  Abdominal:     General: The ostomy site is clean. There is no distension.     Palpations: Abdomen is soft.     Tenderness: There is abdominal tenderness in the periumbilical area and left lower quadrant. There is no right CVA tenderness, left CVA tenderness, guarding or rebound.  Musculoskeletal:     Cervical back: Neck supple.     Right lower leg: No edema.     Left lower leg: No edema.  Skin:    General: Skin is warm and dry.  Neurological:     General: No focal deficit present.     Mental Status: He is alert and oriented to person, place, and time.  Psychiatric:        Mood and Affect: Mood normal.        Behavior: Behavior normal. Behavior is cooperative.   Data Reviewed:  Results are pending, will review when available.  Assessment and Plan: Principal Problem:   SBO (small bowel obstruction) ([facility]CC) Inpatient/MedSurg. Keep NPO. Has not vomited since this morning. Has declined NTG for now. Continue IV fluids. Analgesics as needed. Antiemetics as needed. Pantoprazole 40 mg IVP every 24 hours. Keep electrolytes optimized. Follow-up CBC and CMP in AM. Follow-up imaging in the morning. General surgery input  appreciated.  Active Problems:   Normocytic anemia Monitor hematocrit and hemoglobin.    Hypocalcemia Recheck calcium and albumin level in AM.    Hyponatremia Minimal. Secondary to GI losses. Continue normal saline infusion. Follow sodium level in AM.     Advance Care Planning:   Code Status: Full Code   Consults: [facility] Romie Levee, MD).  Family Communication:   Severity of Illness: The appropriate patient status for this patient is INPATIENT. Inpatient  status is judged to be reasonable and necessary in order to provide the required intensity of service to ensure the patient's safety. The patient's presenting symptoms, physical exam findings, and initial radiographic and laboratory data in the context of their chronic comorbidities is felt to place them at high risk for further clinical deterioration. Furthermore, it is not anticipated that the patient will be medically stable for discharge from the hospital within 2 midnights of admission.   * I certify that at the point of admission it is my clinical judgment that the patient will require inpatient hospital care spanning beyond 2 midnights from the point of admission due to high intensity of service, high risk for further deterioration and high frequency of surveillance required.*  Author: Bobette Mo, MD 01/16/2023 2:57 PM  For on call review [email or web address].   This document was prepared using Dragon voice recognition software and may contain some unintended transcription errors.

## 2023-01-16 NOTE — ED Provider Notes (Signed)
[facility] EMERGENCY DEPARTMENT AT [facility] Provider Note   CSN: [id number] Arrival date & time: 01/16/23  1043     History {Add pertinent medical, surgical, social history, OB history to HPI:1} Chief Complaint  Patient presents with   Abdominal Pain   Nausea   Emesis    Jon Wallace is a 77 y.o. male.  HPI     77 year old male with a history of colon cancer status post colectomy and colostomy, history of recurrent small bowel obstruction  Presents with abdominal pain, began more than 12 hours ago Early took gas-ex, advil, tramadol, didn't see benefit Lower abdominal cramping Vomiting every 30 minutes for 10 hours, no blood.  Didn't take long before it was bile. Has nothad BM for 4 days, around midnight last night irrigated it, emptied it but did not help the pain No urinary symptoms, fever 8-9/10 Medicine helped pain here, nausea improved with medicine  Past Medical History:  Diagnosis Date   Cataract    RIGHT eye sx   Colon cancer ([facility])    2003   Lung cancer ([facility])    2011     Home Medications Prior to Admission medications   Medication Sig Start Date End Date Taking? Authorizing Provider  acetaminophen (TYLENOL) 325 MG tablet Take 325 mg by mouth every 6 (six) hours as needed for mild pain. 04/23/19  Yes [provider]  docusate sodium (STOOL SOFTENER) 100 MG capsule Take 100 mg by mouth 2 (two) times daily as needed for mild constipation.   Yes [provider]  traMADol-acetaminophen (ULTRACET) 37.5-325 MG tablet Take 1 tablet by mouth every 8 (eight) hours as needed. 09/28/22  Yes Lynann Bologna, MD  polyethylene glycol (MIRALAX / GLYCOLAX) 17 g packet Take 17 g by mouth daily as needed for mild constipation or moderate constipation. Patient not taking: Reported on 07/23/2022 09/23/21   Joycelyn Das, MD      Allergies    Morphine and codeine    Review of Systems   Review of Systems  Physical Exam Updated Vital  Signs BP 130/79 (BP Location: Right Arm)   Pulse 74   Temp 98.5 F (36.9 C) (Oral)   Resp 18   Ht 5\' 9"  (1.753 m)   Wt 65.8 kg   SpO2 97%   BMI 21.41 kg/m  Physical Exam  ED Results / Procedures / Treatments   Labs (all labs ordered are listed, but only abnormal results are displayed) Labs Reviewed  CBC WITH DIFFERENTIAL/PLATELET - Abnormal; Notable for the following components:      Result Value   RBC 4.04 (*)    Hemoglobin 12.1 (*)    HCT 36.1 (*)    All other components within normal limits  COMPREHENSIVE METABOLIC PANEL - Abnormal; Notable for the following components:   Sodium 134 (*)    Glucose, Bld 106 (*)    Calcium 8.8 (*)    All other components within normal limits  URINALYSIS, W/ REFLEX TO CULTURE (INFECTION SUSPECTED) - Abnormal; Notable for the following components:   Ketones, ur 20 (*)    Bacteria, UA RARE (*)    All other components within normal limits  LIPASE, BLOOD  CBC  COMPREHENSIVE METABOLIC PANEL    EKG None  Radiology CT ABDOMEN PELVIS W CONTRAST  Result Date: 01/16/2023 CLINICAL DATA:  Lower abdominal pain, nausea, vomiting EXAM: CT ABDOMEN AND PELVIS WITH CONTRAST TECHNIQUE: Multidetector CT imaging of the abdomen and pelvis was performed using the standard protocol  following bolus administration of intravenous contrast. RADIATION DOSE REDUCTION: This exam was performed according to the departmental dose-optimization program which includes automated exposure control, adjustment of the mA and/or kV according to patient size and/or use of iterative reconstruction technique. CONTRAST:  OMNIPAQUE IOHEXOL 350 MG/ML SOLN COMPARISON:  Previous studies including the examination of 09/21/2021 FINDINGS: Lower chest: Linear densities with surgical staples are seen in posterior right lower lung field suggesting scarring. Hepatobiliary: No focal abnormalities are seen in liver. There is no dilation of bile ducts. Gallbladder is not distended. Pancreas: No  focal abnormalities are seen. Spleen: Unremarkable. Adrenals/Urinary Tract: Adrenals are unremarkable. There is no hydronephrosis. There are no renal or ureteral stones. There is 1.7 cm smoothly marginated fluid density lesion in the lower pole of right kidney suggesting renal cyst with slight interval increase in size. Urinary bladder is unremarkable. Stomach/Bowel: Small hiatal hernia is seen. Stomach is unremarkable. There is dilation of proximal small bowel lobes down to the pelvis. Maximum diameter of dilated small bowel loops measures 4 cm. Zone of transition appears to be in lower abdomen at the pelvic inlet. Distal/terminal ileum is not dilated. Appendix is not dilated. 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 Vascular/Lymphatic: Scattered arterial calcifications are seen. Reproductive: Surgical clips are noted close to the prostate. Other: There is minimal ascites adjacent to liver and in the pelvis. There is soft tissue thickening in presacral region along with surgical clips related to previous sigmoid resection. This finding has not changed. Musculoskeletal: Degenerative changes are noted in lumbar spine, more so at L5-S1 level. IMPRESSION: There is high-grade partial small bowel obstruction in lower abdomen/upper pelvis, most likely due to adhesions. Distal/terminal ileum is not dilated. There is no hydronephrosis.  Appendix is not dilated. Minimal ascites.  There is no pneumoperitoneum. Small hiatal hernia. Right renal cyst. Arteriosclerosis. Lumbar spondylosis. Electronically Signed   By: Ernie Avena M.D.   On: 01/16/2023 13:29    Procedures Procedures  {Document cardiac monitor, telemetry assessment procedure when appropriate:1}  Medications Ordered in ED Medications  0.9 % NaCl with KCl 20 mEq/ L  infusion ( Intravenous New Bag/Given 01/16/23 1814)  pantoprazole (PROTONIX) injection 40 mg (40 mg Intravenous Given 01/16/23 1714)  enoxaparin  (LOVENOX) injection 40 mg (40 mg Subcutaneous Given 01/16/23 2137)  acetaminophen (TYLENOL) tablet 650 mg (has no administration in time range)    Or  acetaminophen (TYLENOL) suppository 650 mg (has no administration in time range)  HYDROmorphone (DILAUDID) injection 0.5 mg (has no administration in time range)  ondansetron (ZOFRAN) tablet 4 mg (has no administration in time range)    Or  ondansetron (ZOFRAN) injection 4 mg (has no administration in time range)  ondansetron (ZOFRAN) injection 4 mg (4 mg Intravenous Given 01/16/23 1129)  fentaNYL (SUBLIMAZE) injection 50 mcg (50 mcg Intravenous Given 01/16/23 1129)  iohexol (OMNIPAQUE) 350 MG/ML injection 100 mL (100 mLs Intravenous Contrast Given 01/16/23 1300)  fentaNYL (SUBLIMAZE) injection 50 mcg (50 mcg Intravenous Given 01/16/23 1443)  sodium chloride 0.9 % bolus 1,000 mL (1,000 mLs Intravenous New Bag/Given 01/16/23 1711)    ED Course/ Medical Decision Making/ A&P   {   Click here for ABCD2, HEART and other calculatorsREFRESH Note before signing :1}                          Medical Decision Making Amount and/or Complexity of Data Reviewed Labs: ordered. Radiology: ordered.  Risk Prescription  drug management. Decision regarding hospitalization.   ***  {Document critical care time when appropriate:1} {Document review of labs and clinical decision tools ie heart score, Chads2Vasc2 etc:1}  {Document your independent review of radiology images, and any outside records:1} {Document your discussion with family members, caretakers, and with consultants:1} {Document social determinants of health affecting pt's care:1} {Document your decision making why or why not admission, treatments were needed:1} Final Clinical Impression(s) / ED Diagnoses Final diagnoses:  Partial small bowel obstruction (HCC)    Rx / DC Orders ED Discharge Orders     None

## 2023-01-16 NOTE — Consult Note (Signed)
CC: n/v  Requesting provider: Dr Robb Matar  HPI: Jon Wallace is an 77 y.o. male with a past medical history of rectal cancer s/p APR in 2003 and diagnostic laparoscopy with colostomy revision in 2020 (by Dr. Heide Spark) who presented to [facility] ED abdominal pain and nausea, as well as decreased colostomy output. He states he has these episodes every few months.  Has been hospitalized for this about once a year for the last few years.  Past Medical History:  Diagnosis Date   Cataract    RIGHT eye sx   Colon cancer (HCC)    2003   Lung cancer (HCC)    2011    Past Surgical History:  Procedure Laterality Date   COLON SURGERY  2003   permanent colostomy   COLON SURGERY  2021   colon sx to correct scar tissue/blockages   COLOSTOMY  2003   permanent colostomy placed due to colon CA   INGUINAL HERNIA REPAIR Left 1951   KNEE ARTHROSCOPY W/ MENISCAL REPAIR Left    TONSILLECTOMY     WISDOM TOOTH EXTRACTION      Family History  Problem Relation Age of Onset   Colon polyps Father 44   COPD Father    Throat cancer Father 64   Throat cancer Sister 52   Thyroid cancer Sister 13       and jaw cancer   Colon cancer Paternal Uncle    Stomach cancer Cousin    Pancreatic cancer Neg Hx     Social:  reports that he has quit smoking. He has never used smokeless tobacco. He reports that he does not currently use alcohol. He reports that he does not currently use drugs.  Allergies:  Allergies  Allergen Reactions   Morphine And Codeine Nausea And Vomiting    Medications: I have reviewed the patient's current medications.  Results for orders placed or performed during the hospital encounter of 01/16/23 (from the past 48 hour(s))  CBC with Differential     Status: Abnormal   Collection Time: 01/16/23 11:42 AM  Result Value Ref Range   WBC 8.7 4.0 - 10.5 K/uL   RBC 4.04 (L) 4.22 - 5.81 MIL/uL   Hemoglobin 12.1 (L) 13.0 - 17.0 g/dL   HCT 16.1 (L) 09.6 - 04.5 %   MCV 89.4 80.0 - 100.0  fL   MCH 30.0 26.0 - 34.0 pg   MCHC 33.5 30.0 - 36.0 g/dL   RDW 40.9 81.1 - 91.4 %   Platelets 388 150 - 400 K/uL   nRBC 0.0 0.0 - 0.2 %   Neutrophils Relative % 83 %   Neutro Abs 7.2 1.7 - 7.7 K/uL   Lymphocytes Relative 9 %   Lymphs Abs 0.7 0.7 - 4.0 K/uL   Monocytes Relative 7 %   Monocytes Absolute 0.6 0.1 - 1.0 K/uL   Eosinophils Relative 0 %   Eosinophils Absolute 0.0 0.0 - 0.5 K/uL   Basophils Relative 1 %   Basophils Absolute 0.0 0.0 - 0.1 K/uL   Immature Granulocytes 0 %   Abs Immature Granulocytes 0.03 0.00 - 0.07 K/uL    Comment: Performed at [facility], [address] [place], [place] [address]  Comprehensive metabolic panel     Status: Abnormal   Collection Time: 01/16/23 11:42 AM  Result Value Ref Range   Sodium 134 (L) 135 - 145 mmol/L   Potassium 3.9 3.5 - 5.1 mmol/L   Chloride 100 98 -  111 mmol/L   CO2 23 22 - 32 mmol/L   Glucose, Bld 106 (H) 70 - 99 mg/dL    Comment: Glucose reference range applies only to samples taken after fasting for at least 8 hours.   BUN 23 8 - 23 mg/dL   Creatinine, Ser 1.61 0.61 - 1.24 mg/dL   Calcium 8.8 (L) 8.9 - 10.3 mg/dL   Total Protein 6.9 6.5 - 8.1 g/dL   Albumin 3.9 3.5 - 5.0 g/dL   AST 28 15 - 41 U/L   ALT 18 0 - 44 U/L   Alkaline Phosphatase 38 38 - 126 U/L   Total Bilirubin 1.1 0.3 - 1.2 mg/dL   GFR, Estimated >09 >60 mL/min    Comment: (NOTE) Calculated using the CKD-EPI Creatinine Equation (2021)    Anion gap 11 5 - 15    Comment: Performed at [facility], [address], [place], [place] [address]  Lipase, blood     Status: None   Collection Time: 01/16/23 11:42 AM  Result Value Ref Range   Lipase 31 11 - 51 U/L    Comment: Performed at [facility], [address] [place], [place] [address]    CT ABDOMEN PELVIS W CONTRAST  Result Date: 01/16/2023 CLINICAL DATA:  Lower abdominal pain, nausea, vomiting EXAM: CT ABDOMEN AND PELVIS WITH CONTRAST  TECHNIQUE: Multidetector CT imaging of the abdomen and pelvis was performed using the standard protocol following bolus administration of intravenous contrast. RADIATION DOSE REDUCTION: This exam was performed according to the departmental dose-optimization program which includes automated exposure control, adjustment of the mA and/or kV according to patient size and/or use of iterative reconstruction technique. CONTRAST:  OMNIPAQUE IOHEXOL 350 MG/ML SOLN COMPARISON:  Previous studies including the examination of 09/21/2021 FINDINGS: Lower chest: Linear densities with surgical staples are seen in posterior right lower lung field suggesting scarring. Hepatobiliary: No focal abnormalities are seen in liver. There is no dilation of bile ducts. Gallbladder is not distended. Pancreas: No focal abnormalities are seen. Spleen: Unremarkable. Adrenals/Urinary Tract: Adrenals are unremarkable. There is no hydronephrosis. There are no renal or ureteral stones. There is 1.7 cm smoothly marginated fluid density lesion in the lower pole of right kidney suggesting renal cyst with slight interval increase in size. Urinary bladder is unremarkable. Stomach/Bowel: Small hiatal hernia is seen. Stomach is unremarkable. There is dilation of proximal small bowel lobes down to the pelvis. Maximum diameter of dilated small bowel loops measures 4 cm. Zone of transition appears to be in lower abdomen at the pelvic inlet. Distal/terminal ileum is not dilated. Appendix is not dilated. 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 Vascular/Lymphatic: Scattered arterial calcifications are seen. Reproductive: Surgical clips are noted close to the prostate. Other: There is minimal ascites adjacent to liver and in the pelvis. There is soft tissue thickening in presacral region along with surgical clips related to previous sigmoid resection. This finding has not changed. Musculoskeletal: Degenerative  changes are noted in lumbar spine, more so at L5-S1 level. IMPRESSION: There is high-grade partial small bowel obstruction in lower abdomen/upper pelvis, most likely due to adhesions. Distal/terminal ileum is not dilated. There is no hydronephrosis.  Appendix is not dilated. Minimal ascites.  There is no pneumoperitoneum. Small hiatal hernia. Right renal cyst. Arteriosclerosis. Lumbar spondylosis. Electronically Signed   By: Ernie Avena M.D.   On: 01/16/2023 13:29    ROS - all of the below systems have been reviewed  with the patient and positives are indicated with bold text General: chills, fever or night sweats Eyes: blurry vision or double vision ENT: epistaxis or sore throat Hematologic/Lymphatic: bleeding problems, blood clots or swollen lymph nodes Endocrine: temperature intolerance or unexpected weight changes Breast: new or changing breast lumps or nipple discharge Resp: cough, shortness of breath, or wheezing CV: chest pain or dyspnea on exertion GI: as per HPI GU: dysuria, trouble voiding, or hematuria Neuro: TIA or stroke symptoms    PE Blood pressure 119/80, pulse 75, temperature 98.6 F (37 C), resp. rate 12, height 5\' 9"  (1.753 m), weight 65.8 kg, SpO2 95 %. Constitutional: NAD; conversant; no deformities Eyes: Moist conjunctiva; no lid lag; anicteric; PERRL Neck: Trachea midline; no thyromegaly Lungs: Normal respiratory effort CV: RRR GI: Abd non-distended, no TTP, stool in ostomy bag MSK: Normal range of motion of extremities; no clubbing/cyanosis Psychiatric: Appropriate affect; alert and oriented x3  Results for orders placed or performed during the hospital encounter of 01/16/23 (from the past 48 hour(s))  CBC with Differential     Status: Abnormal   Collection Time: 01/16/23 11:42 AM  Result Value Ref Range   WBC 8.7 4.0 - 10.5 K/uL   RBC 4.04 (L) 4.22 - 5.81 MIL/uL   Hemoglobin 12.1 (L) 13.0 - 17.0 g/dL   HCT 14.7 (L) 82.9 - 56.2 %   MCV 89.4 80.0 -  100.0 fL   MCH 30.0 26.0 - 34.0 pg   MCHC 33.5 30.0 - 36.0 g/dL   RDW 13.0 86.5 - 78.4 %   Platelets 388 150 - 400 K/uL   nRBC 0.0 0.0 - 0.2 %   Neutrophils Relative % 83 %   Neutro Abs 7.2 1.7 - 7.7 K/uL   Lymphocytes Relative 9 %   Lymphs Abs 0.7 0.7 - 4.0 K/uL   Monocytes Relative 7 %   Monocytes Absolute 0.6 0.1 - 1.0 K/uL   Eosinophils Relative 0 %   Eosinophils Absolute 0.0 0.0 - 0.5 K/uL   Basophils Relative 1 %   Basophils Absolute 0.0 0.0 - 0.1 K/uL   Immature Granulocytes 0 %   Abs Immature Granulocytes 0.03 0.00 - 0.07 K/uL    Comment: Performed at [facility], [address], [place], [place] [address]  Comprehensive metabolic panel     Status: Abnormal   Collection Time: 01/16/23 11:42 AM  Result Value Ref Range   Sodium 134 (L) 135 - 145 mmol/L   Potassium 3.9 3.5 - 5.1 mmol/L   Chloride 100 98 - 111 mmol/L   CO2 23 22 - 32 mmol/L   Glucose, Bld 106 (H) 70 - 99 mg/dL    Comment: Glucose reference range applies only to samples taken after fasting for at least 8 hours.   BUN 23 8 - 23 mg/dL   Creatinine, Ser 5.28 0.61 - 1.24 mg/dL   Calcium 8.8 (L) 8.9 - 10.3 mg/dL   Total Protein 6.9 6.5 - 8.1 g/dL   Albumin 3.9 3.5 - 5.0 g/dL   AST 28 15 - 41 U/L   ALT 18 0 - 44 U/L   Alkaline Phosphatase 38 38 - 126 U/L   Total Bilirubin 1.1 0.3 - 1.2 mg/dL   GFR, Estimated >41 >32 mL/min    Comment: (NOTE) Calculated using the CKD-EPI Creatinine Equation (2021)    Anion gap 11 5 - 15    Comment: Performed at [facility], [address] [place], [place] [address]  Lipase, blood  Status: None   Collection Time: 01/16/23 11:42 AM  Result Value Ref Range   Lipase 31 11 - 51 U/L    Comment: Performed at [facility], [address] [place], [place] [address]    CT ABDOMEN PELVIS W CONTRAST  Result Date: 01/16/2023 CLINICAL DATA:  Lower abdominal pain, nausea, vomiting EXAM: CT ABDOMEN AND PELVIS WITH  CONTRAST TECHNIQUE: Multidetector CT imaging of the abdomen and pelvis was performed using the standard protocol following bolus administration of intravenous contrast. RADIATION DOSE REDUCTION: This exam was performed according to the departmental dose-optimization program which includes automated exposure control, adjustment of the mA and/or kV according to patient size and/or use of iterative reconstruction technique. CONTRAST:  OMNIPAQUE IOHEXOL 350 MG/ML SOLN COMPARISON:  Previous studies including the examination of 09/21/2021 FINDINGS: Lower chest: Linear densities with surgical staples are seen in posterior right lower lung field suggesting scarring. Hepatobiliary: No focal abnormalities are seen in liver. There is no dilation of bile ducts. Gallbladder is not distended. Pancreas: No focal abnormalities are seen. Spleen: Unremarkable. Adrenals/Urinary Tract: Adrenals are unremarkable. There is no hydronephrosis. There are no renal or ureteral stones. There is 1.7 cm smoothly marginated fluid density lesion in the lower pole of right kidney suggesting renal cyst with slight interval increase in size. Urinary bladder is unremarkable. Stomach/Bowel: Small hiatal hernia is seen. Stomach is unremarkable. There is dilation of proximal small bowel lobes down to the pelvis. Maximum diameter of dilated small bowel loops measures 4 cm. Zone of transition appears to be in lower abdomen at the pelvic inlet. Distal/terminal ileum is not dilated. Appendix is not dilated. 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 Vascular/Lymphatic: Scattered arterial calcifications are seen. Reproductive: Surgical clips are noted close to the prostate. Other: There is minimal ascites adjacent to liver and in the pelvis. There is soft tissue thickening in presacral region along with surgical clips related to previous sigmoid resection. This finding has not changed. Musculoskeletal:  Degenerative changes are noted in lumbar spine, more so at L5-S1 level. IMPRESSION: There is high-grade partial small bowel obstruction in lower abdomen/upper pelvis, most likely due to adhesions. Distal/terminal ileum is not dilated. There is no hydronephrosis.  Appendix is not dilated. Minimal ascites.  There is no pneumoperitoneum. Small hiatal hernia. Right renal cyst. Arteriosclerosis. Lumbar spondylosis. Electronically Signed   By: Ernie Avena M.D.   On: 01/16/2023 13:29     A/P: Jon Wallace is an 77 y.o. male with a h/o SBO and APR.  He presented to ED with concerns of SBO.  No signs of this clinically.  Rec advance diet as tolerated.     Vanita Panda, MD  Colorectal and General Surgery [facility] Surgery  Total time of evaluation, examination, counseling and implementing medical decisions was 52 mins.  Medical decision making was straightforward decision making.

## 2023-01-17 DIAGNOSIS — K56609 Unspecified intestinal obstruction, unspecified as to partial versus complete obstruction: Secondary | ICD-10-CM | POA: Diagnosis not present

## 2023-01-17 LAB — CBC
HCT: 34.8 % — ABNORMAL LOW (ref 39.0–52.0)
Hemoglobin: 11.3 g/dL — ABNORMAL LOW (ref 13.0–17.0)
MCH: 30.1 pg (ref 26.0–34.0)
MCHC: 32.5 g/dL (ref 30.0–36.0)
MCV: 92.8 fL (ref 80.0–100.0)
Platelets: 353 10*3/uL (ref 150–400)
RBC: 3.75 MIL/uL — ABNORMAL LOW (ref 4.22–5.81)
RDW: 14.4 % (ref 11.5–15.5)
WBC: 6 10*3/uL (ref 4.0–10.5)
nRBC: 0 % (ref 0.0–0.2)

## 2023-01-17 LAB — COMPREHENSIVE METABOLIC PANEL
ALT: 13 U/L (ref 0–44)
AST: 22 U/L (ref 15–41)
Albumin: 3.1 g/dL — ABNORMAL LOW (ref 3.5–5.0)
Alkaline Phosphatase: 31 U/L — ABNORMAL LOW (ref 38–126)
Anion gap: 7 (ref 5–15)
BUN: 17 mg/dL (ref 8–23)
CO2: 25 mmol/L (ref 22–32)
Calcium: 8.2 mg/dL — ABNORMAL LOW (ref 8.9–10.3)
Chloride: 106 mmol/L (ref 98–111)
Creatinine, Ser: 1.13 mg/dL (ref 0.61–1.24)
GFR, Estimated: >60 mL/min (ref >60)
Glucose, Bld: 95 mg/dL (ref 70–99)
Potassium: 4.3 mmol/L (ref 3.5–5.1)
Sodium: 138 mmol/L (ref 135–145)
Total Bilirubin: 1.4 mg/dL — ABNORMAL HIGH (ref 0.3–1.2)
Total Protein: 6.4 g/dL — ABNORMAL LOW (ref 6.5–8.1)

## 2023-01-17 MED ORDER — PANTOPRAZOLE SODIUM 40 MG PO TBEC
40.0000 mg | DELAYED_RELEASE_TABLET | Freq: Every day | ORAL | Status: DC
  Administered 2023-01-17 – 2023-01-18 (×2): 40 mg via ORAL
  Filled 2023-01-17 (×2): qty 1

## 2023-01-17 NOTE — Progress Notes (Signed)
PROGRESS NOTE    Jon Wallace  WUJ:[id number] DOB: [birth year] DOA: 01/16/2023 PCP: [facility] [place]     Brief Narrative:  Jon Wallace is a 77 y.o. 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  However, he has not vomited since 0700.  His last bowel movement was yesterday. CT abdomen/pelvis with contrast show a high-grade partial small bowel obstruction in the lower abdomen/upper pelvis, most likely due to adhesions. No hydronephrosis. Minimal ascites. No pneumoperitoneum. Small hiatal hernia right renal cysts. Atherosclerosis. Lumbar spondylosis.  Patient was admitted for small bowel obstruction.  General surgery consulted.  New events last 24 hours / Subjective: Patient doing well at this morning.  He had no nausea or vomiting, passing flatus.  No abdominal pain.  Assessment & Plan:  Principal Problem:   SBO (small bowel obstruction) (HCC) Active Problems:   Normocytic anemia   Hypocalcemia   Hyponatremia   SBO -With history of colectomy and colostomy -Improving -General surgery following -Advance diet    DVT prophylaxis:  enoxaparin (LOVENOX) injection 40 mg Start: 01/16/23 2200  Code Status: Full Family Communication: None at bedside Disposition Plan: Home Status is: Inpatient Remains inpatient appropriate because: Slowly advancing diet in setting of SBO    Antimicrobials:  Anti-infectives (From admission, onward)    None        Objective: Vitals:   01/16/23 2055 01/17/23 0032 01/17/23 0431 01/17/23 1022  BP: 130/79 114/69 122/75 (!) 153/76  Pulse: 74 77 72 77  Resp: 18 18 18 18   Temp: 98.5 F (36.9 C) 98.7 F (37.1 C) 98.8 F (37.1 C) 98.6 F (37 C)   TempSrc: Oral Oral Oral   SpO2: 97% 97% 97% 100%  Weight:      Height:        Intake/Output Summary (Last 24 hours) at 01/17/2023 1151 Last data filed at 01/17/2023 1022 Gross per 24 hour  Intake 1840.34 ml  Output 1700 ml  Net 140.34 ml   Filed Weights   01/16/23 1051  Weight: 65.8 kg    Examination:  General exam: Appears calm and comfortable  Respiratory system: Clear to auscultation. Respiratory effort normal. No respiratory distress. No conversational dyspnea.  Cardiovascular system: S1 & S2 heard, RRR. No murmurs. No pedal edema. Gastrointestinal system: Abdomen is nondistended, soft and nontender. + Colostomy with stool in place Central nervous system: Alert and oriented. No focal neurological deficits. Speech clear.  Extremities: Symmetric in appearance  Skin: No rashes, lesions or ulcers on exposed skin  Psychiatry: Judgement and insight appear normal. Mood & affect appropriate.   Data Reviewed: I have personally reviewed following labs and imaging studies  CBC: Recent Labs  Lab 01/16/23 1142 01/17/23 0501  WBC 8.7 6.0  NEUTROABS 7.2  --   HGB 12.1* 11.3*  HCT 36.1* 34.8*  MCV 89.4 92.8  PLT 388 353   Basic Metabolic Panel: Recent Labs  Lab 01/16/23 1142 01/17/23 0501  NA 134* 138  K 3.9 4.3  CL 100 106  CO2 23 25  GLUCOSE 106* 95  BUN 23 17  CREATININE 1.11 1.13  CALCIUM 8.8* 8.2*   GFR: Estimated Creatinine Clearance: 49.3 mL/min (by C-G formula based on SCr of 1.13 mg/dL). Liver Function Tests: Recent Labs  Lab  01/16/23 1142 01/17/23 0501  AST 28 22  ALT 18 13  ALKPHOS 38 31*  BILITOT 1.1 1.4*  PROT 6.9 6.4*  ALBUMIN 3.9 3.1*   Recent Labs  Lab 01/16/23 1142  LIPASE 31   No results for input(s): "AMMONIA" in the last 168 hours. Coagulation Profile: No results for input(s): "INR", "PROTIME" in the last 168 hours. Cardiac Enzymes: No results for input(s): "CKTOTAL", "CKMB", "CKMBINDEX", "TROPONINI" in the last 168 hours. BNP  (last 3 results) No results for input(s): "PROBNP" in the last 8760 hours. HbA1C: No results for input(s): "HGBA1C" in the last 72 hours. CBG: No results for input(s): "GLUCAP" in the last 168 hours. Lipid Profile: No results for input(s): "CHOL", "HDL", "LDLCALC", "TRIG", "CHOLHDL", "LDLDIRECT" in the last 72 hours. Thyroid Function Tests: No results for input(s): "TSH", "T4TOTAL", "FREET4", "T3FREE", "THYROIDAB" in the last 72 hours. Anemia Panel: No results for input(s): "VITAMINB12", "FOLATE", "FERRITIN", "TIBC", "IRON", "RETICCTPCT" in the last 72 hours. Sepsis Labs: No results for input(s): "PROCALCITON", "LATICACIDVEN" in the last 168 hours.  No results found for this or any previous visit (from the past 240 hour(s)).    Radiology Studies: CT ABDOMEN PELVIS W CONTRAST  Result Date: 01/16/2023 CLINICAL DATA:  Lower abdominal pain, nausea, vomiting EXAM: CT ABDOMEN AND PELVIS WITH CONTRAST TECHNIQUE: Multidetector CT imaging of the abdomen and pelvis was performed using the standard protocol following bolus administration of intravenous contrast. RADIATION DOSE REDUCTION: This exam was performed according to the departmental dose-optimization program which includes automated exposure control, adjustment of the mA and/or kV according to patient size and/or use of iterative reconstruction technique. CONTRAST:  OMNIPAQUE IOHEXOL 350 MG/ML SOLN COMPARISON:  Previous studies including the examination of 09/21/2021 FINDINGS: Lower chest: Linear densities with surgical staples are seen in posterior right lower lung field suggesting scarring. Hepatobiliary: No focal abnormalities are seen in liver. There is no dilation of bile ducts. Gallbladder is not distended. Pancreas: No focal abnormalities are seen. Spleen: Unremarkable. Adrenals/Urinary Tract: Adrenals are unremarkable. There is no hydronephrosis. There are no renal or ureteral stones. There is 1.7 cm smoothly marginated fluid density  lesion in the lower pole of right kidney suggesting renal cyst with slight interval increase in size. Urinary bladder is unremarkable. Stomach/Bowel: Small hiatal hernia is seen. Stomach is unremarkable. There is dilation of proximal small bowel lobes down to the pelvis. Maximum diameter of dilated small bowel loops measures 4 cm. Zone of transition appears to be in lower abdomen at the pelvic inlet. Distal/terminal ileum is not dilated. Appendix is not dilated. 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 Vascular/Lymphatic: Scattered arterial calcifications are seen. Reproductive: Surgical clips are noted close to the prostate. Other: There is minimal ascites adjacent to liver and in the pelvis. There is soft tissue thickening in presacral region along with surgical clips related to previous sigmoid resection. This finding has not changed. Musculoskeletal: Degenerative changes are noted in lumbar spine, more so at L5-S1 level. IMPRESSION: There is high-grade partial small bowel obstruction in lower abdomen/upper pelvis, most likely due to adhesions. Distal/terminal ileum is not dilated. There is no hydronephrosis.  Appendix is not dilated. Minimal ascites.  There is no pneumoperitoneum. Small hiatal hernia. Right renal cyst. Arteriosclerosis. Lumbar spondylosis. Electronically Signed   By: Ernie Avena M.D.   On: 01/16/2023 13:29      Scheduled Meds:  enoxaparin (LOVENOX) injection  40 mg Subcutaneous Q24H   pantoprazole (PROTONIX) IV  40 mg  Intravenous Q24H   Continuous Infusions:  0.9 % NaCl with KCl 20 mEq / L 125 mL/hr at 01/17/23 0235     LOS: 1 day   Time spent: 25 minutes   Noralee Stain, DO [facility] 01/17/2023, 11:51 AM   Available via Epic secure chat 7am-7pm After these hours, please refer to coverage provider listed on [email or web address]

## 2023-01-17 NOTE — Progress Notes (Signed)
Mobility Specialist - Progress Note   01/17/23 0919  Mobility  Activity Ambulated with assistance in hallway  Level of Assistance Independent  Assistive Device Other (Comment) (IV Pole)  Distance Ambulated (ft) 500 ft  Activity Response Tolerated well  Mobility Referral Yes  $Mobility charge 1 Mobility  Mobility Specialist Start Time (ACUTE ONLY) 0912  Mobility Specialist Stop Time (ACUTE ONLY) [id number]  Mobility Specialist Time Calculation (min) (ACUTE ONLY) 6 min   Pt received in bed and agreeable to mobility. No complaints during session. Pt to bed after session with all needs met.    [facility]

## 2023-01-18 DIAGNOSIS — K56609 Unspecified intestinal obstruction, unspecified as to partial versus complete obstruction: Secondary | ICD-10-CM | POA: Diagnosis not present

## 2023-01-18 NOTE — Progress Notes (Signed)
Subjective: CC: Noted patient already has d/c order in by [facility].  Patient reports crampy abdominal pain n/v that he presented with have resolved. Tolerating soft diet and completed all of his tray. Again, no abdominal pain, n/v today. Having air and liquid ostomy output.   Afebrile. No tachycardia or hypotension. WBC wnl on last check.   Objective: Vital signs in last 24 hours: Temp:  [98.5 F (36.9 C)-98.7 F (37.1 C)] 98.7 F (37.1 C) (06/16 2107) Pulse Rate:  [71-76] 71 (06/16 2107) Resp:  [18-20] 18 (06/16 2107) BP: (118-143)/(77-100) 118/77 (06/16 2107) SpO2:  [98 %-100 %] 98 % (06/16 2107) Last BM Date : 01/16/23  Intake/Output from previous day: 06/16 0701 - 06/17 0700 In: 1787.1 [P.O.:360; I.V.:1427.1] Out: 700 [Urine:700] Intake/Output this shift: Total I/O In: 120 [P.O.:120] Out: -   PE: Gen:  Alert, NAD, pleasant Abd: Soft, ND, NT, +BS. Stoma viable. Ostomy bag with air in bag. There is liquid stool a container in the bathroom that he just emptied.   Lab Results:  Recent Labs    01/16/23 1142 01/17/23 0501  WBC 8.7 6.0  HGB 12.1* 11.3*  HCT 36.1* 34.8*  PLT 388 353   BMET Recent Labs    01/16/23 1142 01/17/23 0501  NA 134* 138  K 3.9 4.3  CL 100 106  CO2 23 25  GLUCOSE 106* 95  BUN 23 17  CREATININE 1.11 1.13  CALCIUM 8.8* 8.2*   PT/INR No results for input(s): "LABPROT", "INR" in the last 72 hours. CMP     Component Value Date/Time   NA 138 01/17/2023 0501   K 4.3 01/17/2023 0501   CL 106 01/17/2023 0501   CO2 25 01/17/2023 0501   GLUCOSE 95 01/17/2023 0501   BUN 17 01/17/2023 0501   CREATININE 1.13 01/17/2023 0501   CREATININE 0.92 05/14/2012 1135   CALCIUM 8.2 (L) 01/17/2023 0501   PROT 6.4 (L) 01/17/2023 0501   ALBUMIN 3.1 (L) 01/17/2023 0501   AST 22 01/17/2023 0501   ALT 13 01/17/2023 0501   ALKPHOS 31 (L) 01/17/2023 0501   BILITOT 1.4 (H) 01/17/2023 0501   GFRNONAA >60 01/17/2023 0501   Lipase     Component  Value Date/Time   LIPASE 31 01/16/2023 1142    Studies/Results: CT ABDOMEN PELVIS W CONTRAST  Result Date: 01/16/2023 CLINICAL DATA:  Lower abdominal pain, nausea, vomiting EXAM: CT ABDOMEN AND PELVIS WITH CONTRAST TECHNIQUE: Multidetector CT imaging of the abdomen and pelvis was performed using the standard protocol following bolus administration of intravenous contrast. RADIATION DOSE REDUCTION: This exam was performed according to the departmental dose-optimization program which includes automated exposure control, adjustment of the mA and/or kV according to patient size and/or use of iterative reconstruction technique. CONTRAST:  OMNIPAQUE IOHEXOL 350 MG/ML SOLN COMPARISON:  Previous studies including the examination of 09/21/2021 FINDINGS: Lower chest: Linear densities with surgical staples are seen in posterior right lower lung field suggesting scarring. Hepatobiliary: No focal abnormalities are seen in liver. There is no dilation of bile ducts. Gallbladder is not distended. Pancreas: No focal abnormalities are seen. Spleen: Unremarkable. Adrenals/Urinary Tract: Adrenals are unremarkable. There is no hydronephrosis. There are no renal or ureteral stones. There is 1.7 cm smoothly marginated fluid density lesion in the lower pole of right kidney suggesting renal cyst with slight interval increase in size. Urinary bladder is unremarkable. Stomach/Bowel: Small hiatal hernia is seen. Stomach is unremarkable. There is dilation of proximal small bowel lobes  down to the pelvis. Maximum diameter of dilated small bowel loops measures 4 cm. Zone of transition appears to be in lower abdomen at the pelvic inlet. Distal/terminal ileum is not dilated. Appendix is not dilated. 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 Vascular/Lymphatic: Scattered arterial calcifications are seen. Reproductive: Surgical clips are noted close to the prostate. Other: There is minimal  ascites adjacent to liver and in the pelvis. There is soft tissue thickening in presacral region along with surgical clips related to previous sigmoid resection. This finding has not changed. Musculoskeletal: Degenerative changes are noted in lumbar spine, more so at L5-S1 level. IMPRESSION: There is high-grade partial small bowel obstruction in lower abdomen/upper pelvis, most likely due to adhesions. Distal/terminal ileum is not dilated. There is no hydronephrosis.  Appendix is not dilated. Minimal ascites.  There is no pneumoperitoneum. Small hiatal hernia. Right renal cyst. Arteriosclerosis. Lumbar spondylosis. Electronically Signed   By: Ernie Avena M.D.   On: 01/16/2023 13:29    Anti-infectives: Anti-infectives (From admission, onward)    None        Assessment/Plan SBO appears clinically resolved. Symptoms resolved. Tolerating diet. Having bowel function. Abdominal exam benign. HDS - afebrile, no tachycardia or hypotension. Agree with plan for d/c.    LOS: 2 days    Jacinto Halim , [facility] 01/18/2023, 10:43 AM Please see Amion for pager number during day hours 7:00am-4:30pm

## 2023-01-18 NOTE — Discharge Summary (Signed)
Physician Discharge Summary  ANDREW BAYUK ZOX:[id number] DOB: 08/25/1945 DOA: 01/16/2023  PCP: [facility] [place]  Admit date: 01/16/2023 Discharge date: 01/18/2023  Admitted From: Home Disposition: Home  Recommendations for Outpatient Follow-up:  Follow up with PCP in 1 week Follow up with general surgery Dr. Inetta Fermo   Discharge Condition: Stable, improved CODE STATUS: Full code Diet recommendation: Soft diet  Brief/Interim Summary: Jon Wallace is a 77 y.o. 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  However, he has not vomited since 0700.  His last bowel movement was yesterday. CT abdomen/pelvis with contrast show a high-grade partial small bowel obstruction in the lower abdomen/upper pelvis, most likely due to adhesions. No hydronephrosis. Minimal ascites. No pneumoperitoneum. Small hiatal hernia right renal cysts. Atherosclerosis. Lumbar spondylosis.  Patient was admitted for small bowel obstruction.  General surgery consulted.  Patient continued to improve with supportive care.  He did not require NG tube.  Diet was advanced and patient had resolution of his symptoms, having output from colostomy.  Discharge Diagnoses:  Principal Problem:   SBO (small bowel obstruction) (HCC) Active Problems:   Normocytic anemia   Hypocalcemia   Hyponatremia    Discharge Instructions  Discharge Instructions     Call MD for:  difficulty breathing, headache or visual disturbances   Complete by: As directed    Call MD for:  extreme fatigue   Complete by: As directed    Call MD for:  persistant dizziness or light-headedness   Complete by: As directed    Call MD for:  persistant nausea and  vomiting   Complete by: As directed    Call MD for:  severe uncontrolled pain   Complete by: As directed    Call MD for:  temperature >100.4   Complete by: As directed    Discharge instructions   Complete by: As directed    You were cared for by a hospitalist during your hospital stay. If you have any questions about your discharge medications or the care you received while you were in the hospital after you are discharged, you can call the unit and ask to speak with the hospitalist on call if the hospitalist that took care of you is not available. Once you are discharged, your primary care physician will handle any further medical issues. Please note that NO REFILLS for any discharge medications will be authorized once you are discharged, as it is imperative that you return to your primary care physician (or establish a relationship with a primary care physician if you do not have one) for your aftercare needs so that they can reassess your need for medications and monitor your lab values.   Increase activity slowly   Complete by: As directed       Allergies as of 01/18/2023       Reactions   Morphine And Codeine Nausea And Vomiting        Medication List     TAKE these medications    acetaminophen 325 MG tablet Commonly known as: TYLENOL Take 325 mg by mouth every 6 (six) hours as needed for mild pain.   polyethylene glycol 17 g packet Commonly known as: MIRALAX / GLYCOLAX Take 17 g by mouth daily as needed for mild  constipation or moderate constipation.   Stool Softener 100 MG capsule Generic drug: docusate sodium Take 100 mg by mouth 2 (two) times daily as needed for mild constipation.   traMADol-acetaminophen 37.5-325 MG tablet Commonly known as: ULTRACET Take 1 tablet by mouth every 8 (eight) hours as needed.        Follow-up Information     [facility] [place] Follow up.   Specialty: Family Medicine Contact information: [address] [place] [address] [phone]         Inetta Fermo, MD Follow up.   Specialty: General Surgery Contact information: [address] [place] [address] [phone]                Allergies  Allergen Reactions   Morphine And Codeine Nausea And Vomiting    Consultations: General surgery    Procedures/Studies: CT ABDOMEN PELVIS W CONTRAST  Result Date: 01/16/2023 CLINICAL DATA:  Lower abdominal pain, nausea, vomiting EXAM: CT ABDOMEN AND PELVIS WITH CONTRAST TECHNIQUE: Multidetector CT imaging of the abdomen and pelvis was performed using the standard protocol following bolus administration of intravenous contrast. RADIATION DOSE REDUCTION: This exam was performed according to the departmental dose-optimization program which includes automated exposure control, adjustment of the mA and/or kV according to patient size and/or use of iterative reconstruction technique. CONTRAST:  OMNIPAQUE IOHEXOL 350 MG/ML SOLN COMPARISON:  Previous studies including the examination of 09/21/2021 FINDINGS: Lower chest: Linear densities with surgical staples are seen in posterior right lower lung field suggesting scarring. Hepatobiliary: No focal abnormalities are seen in liver. There is no dilation of bile ducts. Gallbladder is not distended. Pancreas: No focal abnormalities are seen. Spleen: Unremarkable. Adrenals/Urinary Tract: Adrenals are unremarkable. There is no hydronephrosis. There are no renal or ureteral stones. There is 1.7 cm smoothly marginated fluid density lesion in the lower pole of right kidney suggesting renal cyst with slight interval increase in size. Urinary bladder is unremarkable. Stomach/Bowel: Small hiatal hernia is seen. Stomach is unremarkable. There is dilation of proximal small bowel lobes down to the pelvis. Maximum diameter of dilated small bowel loops measures 4 cm. Zone of transition appears to be in lower abdomen at the pelvic inlet.  Distal/terminal ileum is not dilated. Appendix is not dilated. 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 Vascular/Lymphatic: Scattered arterial calcifications are seen. Reproductive: Surgical clips are noted close to the prostate. Other: There is minimal ascites adjacent to liver and in the pelvis. There is soft tissue thickening in presacral region along with surgical clips related to previous sigmoid resection. This finding has not changed. Musculoskeletal: Degenerative changes are noted in lumbar spine, more so at L5-S1 level. IMPRESSION: There is high-grade partial small bowel obstruction in lower abdomen/upper pelvis, most likely due to adhesions. Distal/terminal ileum is not dilated. There is no hydronephrosis.  Appendix is not dilated. Minimal ascites.  There is no pneumoperitoneum. Small hiatal hernia. Right renal cyst. Arteriosclerosis. Lumbar spondylosis. Electronically Signed   By: Ernie Avena M.D.   On: 01/16/2023 13:29       Discharge Exam: Vitals:   01/17/23 1520 01/17/23 2107  BP: (!) 143/100 118/77  Pulse: 76 71  Resp: 20 18  Temp: 98.5 F (36.9 C) 98.7 F (37.1 C)  SpO2: 100% 98%    General: Pt is alert, awake, not in acute distress Cardiovascular: RRR, S1/S2 +, no edema Respiratory: CTA bilaterally, no wheezing, no rhonchi, no respiratory distress, no conversational  dyspnea  Abdominal: Soft, NT, ND, bowel sounds +  Extremities: no edema, no cyanosis Psych: Normal mood and affect, stable judgement and insight     The results of significant diagnostics from this hospitalization (including imaging, microbiology, ancillary and laboratory) are listed below for reference.     Microbiology: No results found for this or any previous visit (from the past 240 hour(s)).   Labs: BNP (last 3 results) No results for input(s): "BNP" in the last 8760 hours. Basic Metabolic Panel: Recent Labs  Lab 01/16/23 1142 01/17/23 0501   NA 134* 138  K 3.9 4.3  CL 100 106  CO2 23 25  GLUCOSE 106* 95  BUN 23 17  CREATININE 1.11 1.13  CALCIUM 8.8* 8.2*   Liver Function Tests: Recent Labs  Lab 01/16/23 1142 01/17/23 0501  AST 28 22  ALT 18 13  ALKPHOS 38 31*  BILITOT 1.1 1.4*  PROT 6.9 6.4*  ALBUMIN 3.9 3.1*   Recent Labs  Lab 01/16/23 1142  LIPASE 31   No results for input(s): "AMMONIA" in the last 168 hours. CBC: Recent Labs  Lab 01/16/23 1142 01/17/23 0501  WBC 8.7 6.0  NEUTROABS 7.2  --   HGB 12.1* 11.3*  HCT 36.1* 34.8*  MCV 89.4 92.8  PLT 388 353   Cardiac Enzymes: No results for input(s): "CKTOTAL", "CKMB", "CKMBINDEX", "TROPONINI" in the last 168 hours. BNP: Invalid input(s): "POCBNP" CBG: No results for input(s): "GLUCAP" in the last 168 hours. D-Dimer No results for input(s): "DDIMER" in the last 72 hours. Hgb A1c No results for input(s): "HGBA1C" in the last 72 hours. Lipid Profile No results for input(s): "CHOL", "HDL", "LDLCALC", "TRIG", "CHOLHDL", "LDLDIRECT" in the last 72 hours. Thyroid function studies No results for input(s): "TSH", "T4TOTAL", "T3FREE", "THYROIDAB" in the last 72 hours.  Invalid input(s): "FREET3" Anemia work up No results for input(s): "VITAMINB12", "FOLATE", "FERRITIN", "TIBC", "IRON", "RETICCTPCT" in the last 72 hours. Urinalysis    Component Value Date/Time   COLORURINE YELLOW 01/16/2023 1450   APPEARANCEUR CLEAR 01/16/2023 1450   LABSPEC 1.025 01/16/2023 1450   PHURINE 6.0 01/16/2023 1450   GLUCOSEU NEGATIVE 01/16/2023 1450   HGBUR NEGATIVE 01/16/2023 1450   BILIRUBINUR NEGATIVE 01/16/2023 1450   KETONESUR 20 (A) 01/16/2023 1450   PROTEINUR NEGATIVE 01/16/2023 1450   NITRITE NEGATIVE 01/16/2023 1450   LEUKOCYTESUR NEGATIVE 01/16/2023 1450   Sepsis Labs Recent Labs  Lab 01/16/23 1142 01/17/23 0501  WBC 8.7 6.0   Microbiology No results found for this or any previous visit (from the past 240 hour(s)).   Patient was seen and  examined on the day of discharge and was found to be in stable condition. Time coordinating discharge: 25 minutes including assessment and coordination of care, as well as examination of the patient.   SIGNED:  Noralee Stain, DO [facility] 01/18/2023, 10:22 AM

## 2023-01-18 NOTE — Progress Notes (Signed)
Discharge instructions discussed with patient, verbalized agreement and understanding 

## 2023-04-15 DIAGNOSIS — J069 Acute upper respiratory infection, unspecified: Secondary | ICD-10-CM | POA: Diagnosis not present

## 2023-04-15 DIAGNOSIS — Z03818 Encounter for observation for suspected exposure to other biological agents ruled out: Secondary | ICD-10-CM | POA: Diagnosis not present

## 2023-04-15 DIAGNOSIS — R051 Acute cough: Secondary | ICD-10-CM | POA: Diagnosis not present

## 2023-04-16 ENCOUNTER — Ambulatory Visit
Admission: RE | Admit: 2023-04-16 | Discharge: 2023-04-16 | Disposition: A | Discharge status: Home / Self Care | Payer: Medicare Other | Source: Ambulatory Visit | Attending: Family Medicine | Admitting: Family Medicine

## 2023-04-16 ENCOUNTER — Other Ambulatory Visit: Payer: Self-pay | Admitting: Family Medicine

## 2023-04-16 ENCOUNTER — Other Ambulatory Visit (HOSPITAL_BASED_OUTPATIENT_CLINIC_OR_DEPARTMENT_OTHER): Payer: Self-pay | Admitting: Family Medicine

## 2023-04-16 ENCOUNTER — Other Ambulatory Visit (HOSPITAL_COMMUNITY): Payer: Self-pay | Admitting: Family Medicine

## 2023-04-16 DIAGNOSIS — R519 Headache, unspecified: Secondary | ICD-10-CM

## 2023-04-16 DIAGNOSIS — R5383 Other fatigue: Secondary | ICD-10-CM | POA: Diagnosis not present

## 2023-04-16 DIAGNOSIS — Z681 Body mass index (BMI) 19 or less, adult: Secondary | ICD-10-CM | POA: Diagnosis not present

## 2023-04-16 DIAGNOSIS — R42 Dizziness and giddiness: Secondary | ICD-10-CM | POA: Diagnosis not present

## 2023-04-21 ENCOUNTER — Emergency Department (HOSPITAL_BASED_OUTPATIENT_CLINIC_OR_DEPARTMENT_OTHER)
Admission: EM | Admit: 2023-04-21 | Discharge: 2023-04-21 | Disposition: A | Discharge status: Home / Self Care | Payer: Medicare Other | Attending: Emergency Medicine | Admitting: Emergency Medicine

## 2023-04-21 ENCOUNTER — Other Ambulatory Visit: Payer: Self-pay

## 2023-04-21 ENCOUNTER — Emergency Department (HOSPITAL_BASED_OUTPATIENT_CLINIC_OR_DEPARTMENT_OTHER): Payer: Medicare Other

## 2023-04-21 ENCOUNTER — Encounter (HOSPITAL_BASED_OUTPATIENT_CLINIC_OR_DEPARTMENT_OTHER): Payer: Self-pay | Admitting: Emergency Medicine

## 2023-04-21 DIAGNOSIS — Z681 Body mass index (BMI) 19 or less, adult: Secondary | ICD-10-CM | POA: Diagnosis not present

## 2023-04-21 DIAGNOSIS — Z1152 Encounter for screening for COVID-19: Secondary | ICD-10-CM | POA: Insufficient documentation

## 2023-04-21 DIAGNOSIS — R1084 Generalized abdominal pain: Secondary | ICD-10-CM | POA: Diagnosis not present

## 2023-04-21 DIAGNOSIS — R509 Fever, unspecified: Secondary | ICD-10-CM | POA: Diagnosis not present

## 2023-04-21 DIAGNOSIS — R109 Unspecified abdominal pain: Secondary | ICD-10-CM | POA: Diagnosis not present

## 2023-04-21 DIAGNOSIS — N179 Acute kidney failure, unspecified: Secondary | ICD-10-CM | POA: Diagnosis not present

## 2023-04-21 DIAGNOSIS — R059 Cough, unspecified: Secondary | ICD-10-CM | POA: Diagnosis not present

## 2023-04-21 DIAGNOSIS — Z933 Colostomy status: Secondary | ICD-10-CM | POA: Diagnosis not present

## 2023-04-21 DIAGNOSIS — R5383 Other fatigue: Secondary | ICD-10-CM | POA: Diagnosis not present

## 2023-04-21 DIAGNOSIS — I9589 Other hypotension: Secondary | ICD-10-CM | POA: Diagnosis not present

## 2023-04-21 DIAGNOSIS — R5381 Other malaise: Secondary | ICD-10-CM | POA: Diagnosis not present

## 2023-04-21 LAB — COMPREHENSIVE METABOLIC PANEL WITH GFR
ALT: 8 U/L (ref 0–44)
AST: 16 U/L (ref 15–41)
Albumin: 4 g/dL (ref 3.5–5.0)
Alkaline Phosphatase: 36 U/L — ABNORMAL LOW (ref 38–126)
Anion gap: 9 (ref 5–15)
BUN: 17 mg/dL (ref 8–23)
CO2: 25 mmol/L (ref 22–32)
Calcium: 8.6 mg/dL — ABNORMAL LOW (ref 8.9–10.3)
Chloride: 98 mmol/L (ref 98–111)
Creatinine, Ser: 1.23 mg/dL (ref 0.61–1.24)
GFR, Estimated: 60 mL/min — ABNORMAL LOW (ref >60)
Glucose, Bld: 100 mg/dL — ABNORMAL HIGH (ref 70–99)
Potassium: 4.6 mmol/L (ref 3.5–5.1)
Sodium: 132 mmol/L — ABNORMAL LOW (ref 135–145)
Total Bilirubin: 0.4 mg/dL (ref 0.3–1.2)
Total Protein: 7.2 g/dL (ref 6.5–8.1)

## 2023-04-21 LAB — URINALYSIS, ROUTINE W REFLEX MICROSCOPIC
Bilirubin Urine: NEGATIVE
Glucose, UA: NEGATIVE mg/dL
Hgb urine dipstick: NEGATIVE
Ketones, ur: NEGATIVE mg/dL
Leukocytes,Ua: NEGATIVE
Nitrite: NEGATIVE
Specific Gravity, Urine: 1.025 (ref 1.005–1.030)
pH: 5.5 (ref 5.0–8.0)

## 2023-04-21 LAB — CBC
HCT: 33.5 % — ABNORMAL LOW (ref 39.0–52.0)
Hemoglobin: 11.3 g/dL — ABNORMAL LOW (ref 13.0–17.0)
MCH: 30.1 pg (ref 26.0–34.0)
MCHC: 33.7 g/dL (ref 30.0–36.0)
MCV: 89.3 fL (ref 80.0–100.0)
Platelets: 471 10*3/uL — ABNORMAL HIGH (ref 150–400)
RBC: 3.75 MIL/uL — ABNORMAL LOW (ref 4.22–5.81)
RDW: 13.2 % (ref 11.5–15.5)
WBC: 6.6 10*3/uL (ref 4.0–10.5)
nRBC: 0 % (ref 0.0–0.2)

## 2023-04-21 LAB — SARS CORONAVIRUS 2 BY RT PCR: SARS Coronavirus 2 by RT PCR: NEGATIVE

## 2023-04-21 LAB — LIPASE, BLOOD: Lipase: 63 U/L — ABNORMAL HIGH (ref 11–51)

## 2023-04-21 MED ORDER — LACTATED RINGERS IV BOLUS
2000.0000 mL | Freq: Once | INTRAVENOUS | Status: AC
  Administered 2023-04-21: 2000 mL via INTRAVENOUS

## 2023-04-21 MED ORDER — IOHEXOL 300 MG/ML  SOLN
100.0000 mL | Freq: Once | INTRAMUSCULAR | Status: AC | PRN
  Administered 2023-04-21: 85 mL via INTRAVENOUS

## 2023-04-21 NOTE — ED Notes (Signed)
Pt ambulatory to restroom

## 2023-04-21 NOTE — ED Notes (Signed)
Pt gone to ct 

## 2023-04-21 NOTE — ED Triage Notes (Signed)
Pt via pov from pcp with fatigue, cough, sore throat, headache x 1 week. Pt's pcp told him he had AKI. Pts's bp at pcp was 87/50. Pt alert & oriented, nad noted.

## 2023-04-21 NOTE — ED Notes (Addendum)
Pt labelled as a fall risk due to complaints of dizziness. Yellow socks placed on pt's feet, fall risk band placed on pt wrist and yellow fall risk signed placed outside of door. Pt educated of the importance of calling for assistance before attempting to ambulate. Call bell and personal belonging placed in reach on bedside table.

## 2023-04-21 NOTE — ED Provider Notes (Signed)
[place] [facility] AT [facility] Provider Note   CSN: [id number] Arrival date & time: 04/21/23  1545     History Chief Complaint  Patient presents with   Fatigue   Headache   Cough    HPI Jon Wallace is a 77 y.o. male presenting for multiple complaints. 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   Review of Systems   Review of Systems  Constitutional:  Negative for chills and fever.  HENT:  Negative for ear pain and sore throat.   Eyes:  Negative for pain and visual disturbance.  Respiratory:  Negative for cough and shortness of breath.   Cardiovascular:  Negative for chest pain and palpitations.  Gastrointestinal:  Positive for abdominal pain, nausea and vomiting.  Genitourinary:  Negative for dysuria and hematuria.  Musculoskeletal:  Negative for arthralgias and back pain.  Skin:  Negative for color change and rash.  Neurological:  Negative for seizures and syncope.  All other systems reviewed and are negative.   Physical Exam Updated Vital Signs BP (!) 151/86   Pulse 91   Temp 98.6 F (37 C)   Resp 19   Ht 5\' 9"  (1.753 m)   Wt 60.1 kg   SpO2 100%   BMI 19.55 kg/m  Physical Exam Vitals and nursing note reviewed.  Constitutional:      General: He is not in acute distress.    Appearance: He is well-developed.  HENT:     Head: Normocephalic and atraumatic.  Eyes:     Conjunctiva/sclera: Conjunctivae normal.  Cardiovascular:     Rate and Rhythm: Normal rate and regular rhythm.     Heart sounds: No murmur heard. Pulmonary:     Effort: Pulmonary  effort is normal. No respiratory distress.     Breath sounds: Normal breath sounds.  Abdominal:     General: There is no distension.     Palpations: Abdomen is soft.     Tenderness: There is abdominal tenderness. There is no guarding or rebound.  Musculoskeletal:        General: No swelling.     Cervical back: Neck supple.  Skin:    General: Skin is warm and dry.     Capillary Refill: Capillary refill takes less than 2 seconds.  Neurological:     Mental Status: He is alert.  Psychiatric:        Mood and Affect: Mood normal.      ED Course/ Medical Decision Making/ A&P Procedures Procedures   Medications Ordered in ED Medications  lactated ringers bolus 2,000 mL (0 mLs Intravenous Stopped 04/21/23 2010)  iohexol (OMNIPAQUE) 300 MG/ML solution 100 mL (85 mLs Intravenous Contrast Given 04/21/23 1808)   Medical Decision Making:   Jon Wallace is a 77 y.o. male who presented to the ED today with abdominal pain, detailed above.    Patient placed on continuous vitals and telemetry monitoring while in ED which was reviewed periodically.  Complete initial physical exam performed, notably the patient  was HDS in NAD. Tender RLQ.  Reviewed and confirmed nursing documentation for past medical history, family history, social history.    Initial Assessment:   With the patient's presentation of abdominal pain, most likely diagnosis is nonspecific etiology. Other diagnoses were considered including (but not limited to) gastroenteritis, colitis, small bowel obstruction, appendicitis, cholecystitis, pancreatitis, nephrolithiasis, UTI, pyleonephritis.  These are considered less likely due to history of present illness and physical exam findings.   This is most consistent with an acute life/limb threatening illness complicated by underlying chronic conditions.   Initial Plan:  CBC/CMP to evaluate for underlying infectious/metabolic etiology for patient's abdominal pain  Lipase to evaluate  for pancreatitis  EKG to evaluate for cardiac source of pain  CTAB/Pelvis with contrast to evaluate for structural/surgical etiology of patients' severe abdominal pain.  Urinalysis and repeat physical assessment to evaluate for UTI/Pyelonpehritis  Empiric management of symptoms with escalating pain control and antiemetics as needed.   Initial Study Results:   Laboratory  All laboratory results reviewed without evidence of clinically relevant pathology.    EKG EKG was reviewed independently. Rate, rhythm, axis, intervals all examined and without medically relevant abnormality. ST segments without concerns for elevations.    Radiology All images reviewed independently. Agree with radiology report at this time.   CT ABDOMEN PELVIS W CONTRAST  Result Date: 04/21/2023 CLINICAL DATA:  Acute generalized abdominal pain for 1 week. EXAM: CT ABDOMEN AND PELVIS WITH CONTRAST TECHNIQUE: Multidetector CT imaging of the abdomen and pelvis was performed using the standard protocol following bolus administration of intravenous contrast. RADIATION DOSE REDUCTION: This exam was performed according to the departmental dose-optimization program which includes automated exposure control, adjustment of the mA and/or kV according to patient size and/or use of iterative reconstruction technique. CONTRAST:  85mL OMNIPAQUE IOHEXOL 300 MG/ML  SOLN COMPARISON:  January 16, 2023. FINDINGS: Lower chest: No acute abnormality. Hepatobiliary: No focal liver abnormality is seen. No gallstones, gallbladder wall thickening, or biliary dilatation. Pancreas: Unremarkable. No pancreatic ductal dilatation or surrounding inflammatory changes. Spleen: Normal in size without focal abnormality. Adrenals/Urinary Tract: Adrenal glands are unremarkable. Kidneys are normal, without renal calculi, focal lesion, or hydronephrosis. Bladder is unremarkable. Stomach/Bowel: Stomach is unremarkable. 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 There is no evidence of bowel obstruction or inflammation. Appendix is unremarkable. Vascular/Lymphatic: No significant vascular findings are present. No enlarged abdominal or pelvic lymph nodes. Reproductive: Surgical clips are noted around the prostate gland. Other: No abdominal wall hernia or abnormality. No abdominopelvic ascites. Musculoskeletal: No acute or significant osseous findings. IMPRESSION: No acute abnormality seen in the abdomen or pelvis. Electronically Signed   By: Lupita Raider M.D.   On: 04/21/2023 18:56   DG Chest Portable 1 View  Result Date: 04/21/2023 CLINICAL DATA:  Cough, fever. EXAM: PORTABLE CHEST 1 VIEW COMPARISON:  November 18, 2017. FINDINGS: The heart size and mediastinal contours are within normal limits. Stable biapical scarring. Stable right basilar scarring. No definite acute pulmonary abnormality seen. The visualized skeletal structures are unremarkable. IMPRESSION: No definite acute abnormality. Electronically Signed   By: Lupita Raider M.D.   On: 04/21/2023 18:47   CT HEAD WO CONTRAST ( )  Result Date: 04/16/2023 CLINICAL DATA:  Sharp headache. EXAM: CT HEAD WITHOUT CONTRAST TECHNIQUE: Contiguous axial images were obtained from the base of the skull through the vertex without intravenous contrast. RADIATION DOSE REDUCTION: This exam was performed according to the departmental dose-optimization program which includes automated exposure control, adjustment of the mA and/or kV according  to patient size and/or use of iterative reconstruction technique. COMPARISON:  CT Head 06/15/07 FINDINGS: Brain: No evidence of acute infarction, hemorrhage, hydrocephalus, extra-axial collection or mass lesion/mass effect. Vascular: No hyperdense vessel or unexpected calcification. Skull: Normal. Negative for fracture or focal lesion. Sinuses/Orbits: No middle ear or mastoid effusion. Paranasal sinuses are clear. Right lens replacement. Orbits are otherwise unremarkable.  Other: None. IMPRESSION: No acute intracranial abnormality. Electronically Signed   By: Lorenza Cambridge M.D.   On: 04/16/2023 15:43    Final Reassessment and Plan:   On reassessment there is no focal pathology.  He feels that he would be able to keep p.o. intake down.  He is requesting discharge to make it to a restaurant before it closes. Given resolution of symptoms I believe patient stable for outpatient care management.  Strict return precautions reinforced regarding uncertain etiology of his symptoms  Disposition:  I have considered need for hospitalization, however, considering all of the above, I believe this patient is stable for discharge at this time.  Patient/family educated about specific return precautions for given chief complaint and symptoms.  Patient/family educated about follow-up with PCP.     Patient/family expressed understanding of return precautions and need for follow-up. Patient spoken to regarding all imaging and laboratory results and appropriate follow up for these results. All education provided in verbal form with additional information in written form. Time was allowed for answering of patient questions. Patient discharged.    Emergency Department Medication Summary:   Medications  lactated ringers bolus 2,000 mL (0 mLs Intravenous Stopped 04/21/23 2010)  iohexol (OMNIPAQUE) 300 MG/ML solution 100 mL (85 mLs Intravenous Contrast Given 04/21/23 1808)            Clinical Impression:  1. Generalized abdominal pain      Discharge   Final Clinical Impression(s) / ED Diagnoses Final diagnoses:  Generalized abdominal pain    Rx / DC Orders ED Discharge Orders     None         Glyn Ade, MD 04/21/23 2035

## 2023-04-21 NOTE — ED Notes (Signed)
Pt back from ct

## 2023-07-08 DIAGNOSIS — H43812 Vitreous degeneration, left eye: Secondary | ICD-10-CM | POA: Diagnosis not present

## 2023-07-08 DIAGNOSIS — H35371 Puckering of macula, right eye: Secondary | ICD-10-CM | POA: Diagnosis not present

## 2023-07-08 DIAGNOSIS — H2512 Age-related nuclear cataract, left eye: Secondary | ICD-10-CM | POA: Diagnosis not present

## 2023-07-08 DIAGNOSIS — H353131 Nonexudative age-related macular degeneration, bilateral, early dry stage: Secondary | ICD-10-CM | POA: Diagnosis not present

## 2023-08-23 IMAGING — CT CT ABD-PELV W/ CM
2 of 5 series · 15 of 46 positions shown, 17 images · IV contrast (agent unspecified)
Comparison: 04/12/2021

CLINICAL DATA: History of colon cancer. Status post colostomy. No
stool for 3 days. Vomiting and abdominal pain.

EXAM:
CT ABDOMEN AND PELVIS WITH CONTRAST
TECHNIQUE: Multidetector CT imaging of the abdomen and pelvis was performed
using the standard protocol following bolus administration of
intravenous contrast.

[Series 2: axial st · axial · 0.68mm/px · z∈[+1137,+1512]mm · 12 of 87 slices shown, 14 images]
[im 6/87  soft-tissue]
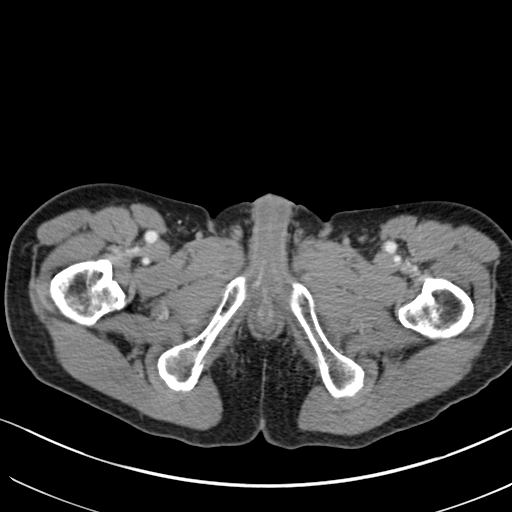
[im 6/87  bone]
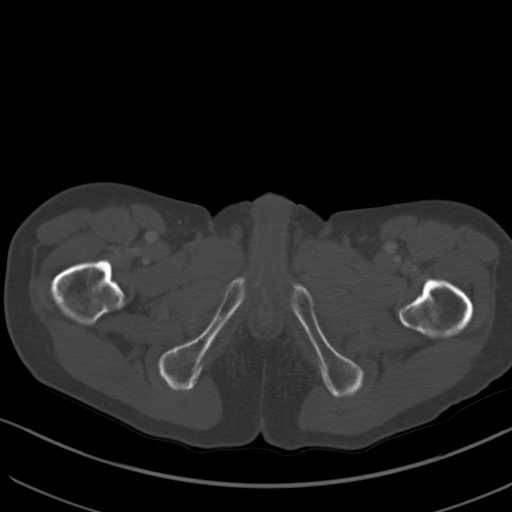
[im 12/87  soft-tissue]
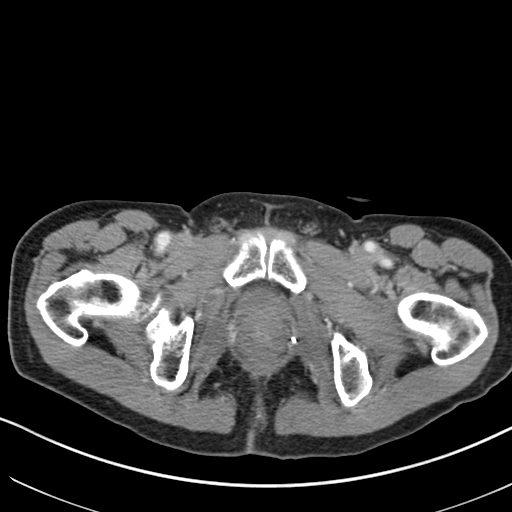
[im 18/87  soft-tissue]
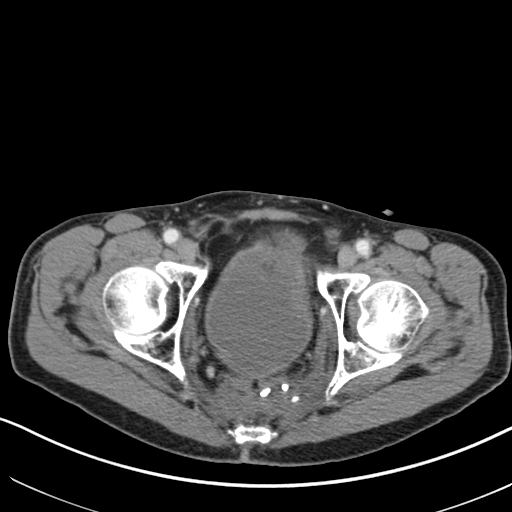
[im 29/87  soft-tissue]
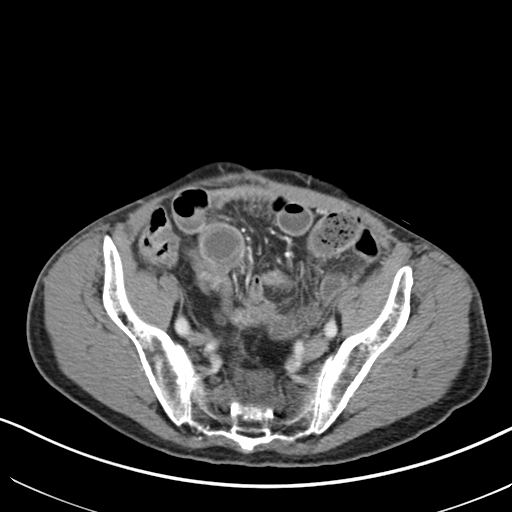
[im 35/87  soft-tissue]
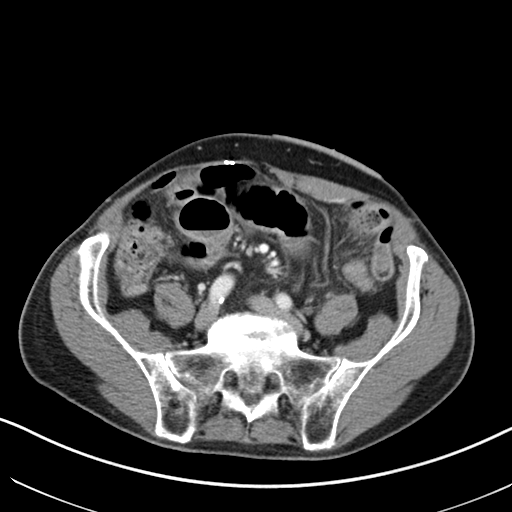
[im 41/87  soft-tissue]
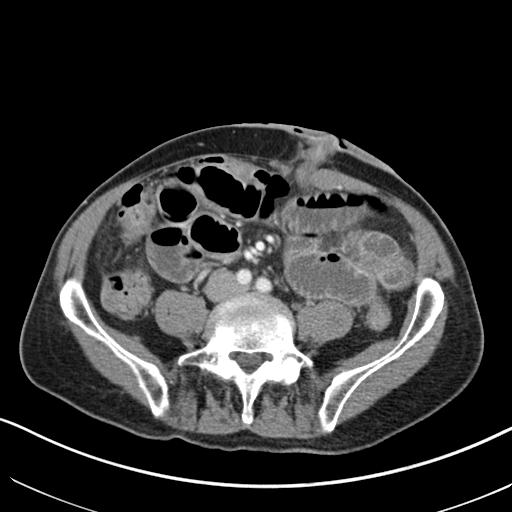
[im 46/87  soft-tissue]
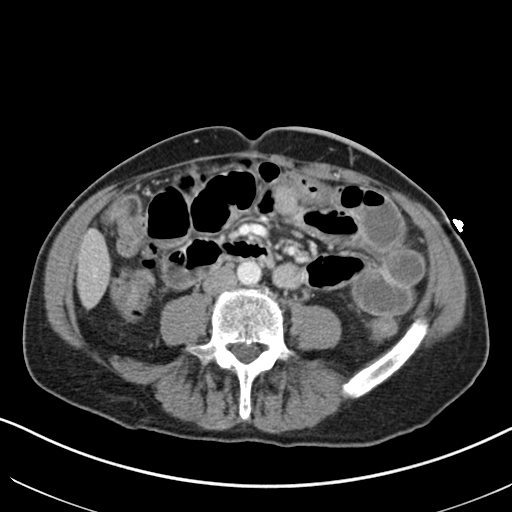
[im 52/87  soft-tissue]
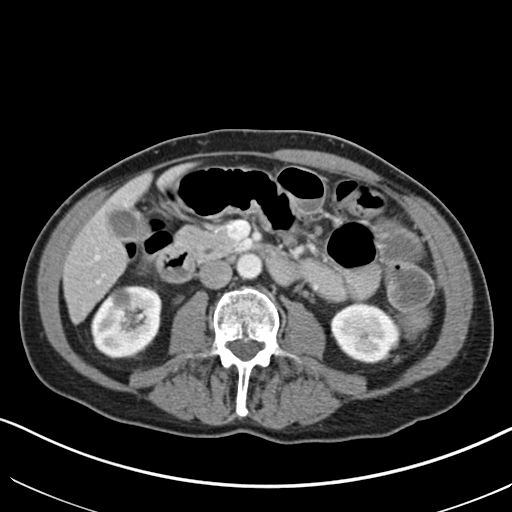
[im 58/87  soft-tissue]
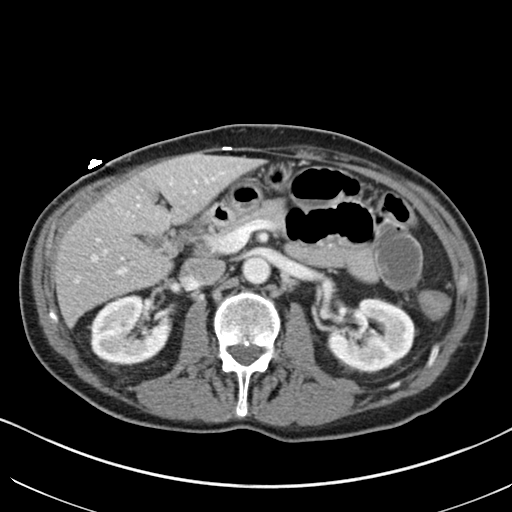
[im 58/87  bone]
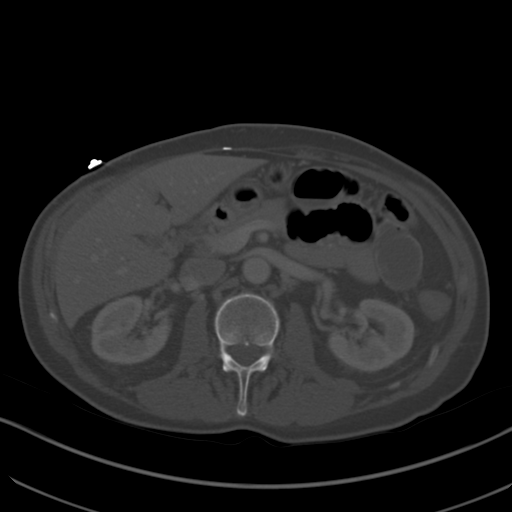
[im 69/87  soft-tissue]
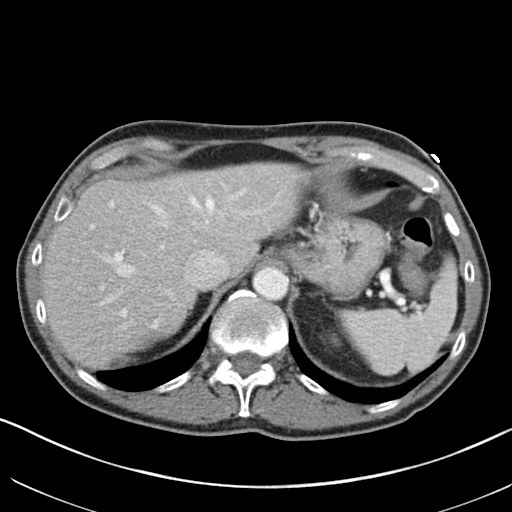
[im 75/87  soft-tissue]
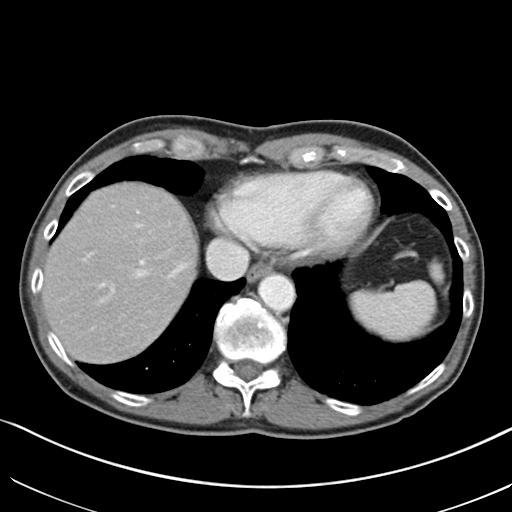
[im 81/87  soft-tissue]
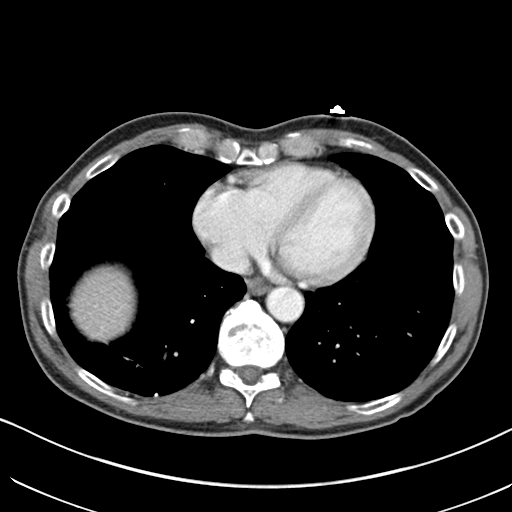

[Series 4: coronal st · coronal · 0.68mm/px · 3 of 128 slices shown]
[im 43/128  soft-tissue]
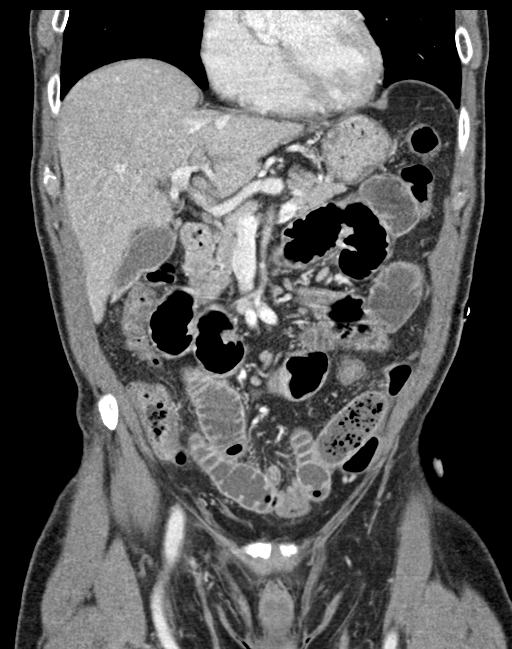
[im 57/128  soft-tissue]
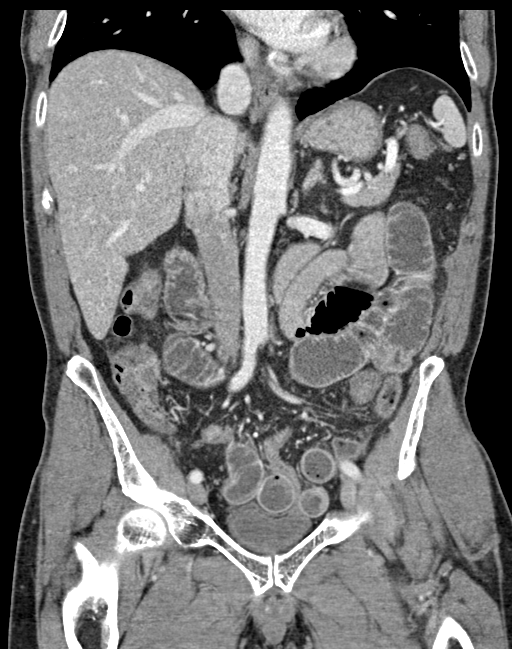
[im 71/128  soft-tissue]
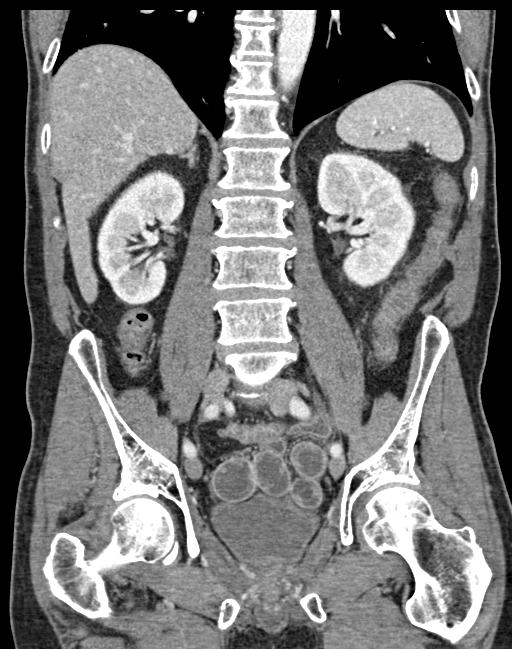

[15 of 46 positions shown; findings below may reference images not displayed]

RADIATION DOSE REDUCTION: This exam was performed according to the
departmental dose-optimization program which includes automated
exposure control, adjustment of the mA and/or kV according to
patient size and/or use of iterative reconstruction technique.

CONTRAST:  100mL OMNIPAQUE IOHEXOL 300 MG/ML  SOLN
FINDINGS: Lower chest: Surgical scarring noted posterior right lower lobe.

Hepatobiliary: No suspicious focal abnormality within the liver
parenchyma. Small area of low attenuation in the anterior liver,
adjacent to the falciform ligament, is in a characteristic location
for focal fatty deposition. There is no evidence for gallstones,
gallbladder wall thickening, or pericholecystic fluid. No
intrahepatic or extrahepatic biliary dilation.

Pancreas: No focal mass lesion. No dilatation of the main duct. No
intraparenchymal cyst. No peripancreatic edema.

Spleen: No splenomegaly. No focal mass lesion.

Adrenals/Urinary Tract: No adrenal nodule or mass. Small cyst noted
right kidney. Left kidney unremarkable. No evidence for hydroureter.
The urinary bladder appears normal for the degree of distention.

Stomach/Bowel: Stomach is unremarkable. No gastric wall thickening.
No evidence of outlet obstruction. Duodenum is normally positioned
as is the ligament of Treitz. Small bowel loops in the mid abdomen
are mildly distended up to 3.4 cm diameter. There is some transmural
hyperenhancement of small bowel in the pelvis. A loop with fecalized
contents is identified in the anterior pelvis (image 57/2 that
abruptly tapers in small bowel distal to this point contains only
gas (well demonstrated on coronal image 40/4). This is in the same
region as the hyperenhancing bowel seen on CT scan from 04/12/2021
and is just deep to the anterior peritoneum. Distal and terminal
ileum is decompressed. Colon is diffusely decompressed with left
abdominal sigmoid end colostomy noted. Surgical changes in the
pelvis compatible with APR.

Vascular/Lymphatic: No abdominal aortic aneurysm. There is no
gastrohepatic or hepatoduodenal ligament lymphadenopathy. No
retroperitoneal or mesenteric lymphadenopathy. No pelvic sidewall
lymphadenopathy.

Reproductive: Unremarkable.

Other: Trace intraperitoneal free fluid evident.

Musculoskeletal: No worrisome lytic or sclerotic osseous
abnormality.
IMPRESSION: 1. Small bowel loops in the mid abdomen are mildly distended up to
3.4 cm diameter. There is some transmural hyperenhancement of small
bowel in the pelvis. A small bowel loop with fecalized contents is
identified in the anterior pelvis that abruptly tapers into a
nondilated loop the contains only gas but no enteric contents. This
is in the same region as the hyperenhancing bowel seen on CT scan
from 04/12/2021 and is just deep to the anterior peritoneal lining.
Imaging features are compatible with a small bowel obstruction.
Distal ileum beyond this point is decompressed.
2. Trace intraperitoneal free fluid.
3. Status post APR with left abdominal sigmoid end colostomy.

## 2023-09-04 ENCOUNTER — Other Ambulatory Visit: Payer: Self-pay

## 2023-09-04 ENCOUNTER — Emergency Department (HOSPITAL_COMMUNITY): Payer: Medicare Other

## 2023-09-04 ENCOUNTER — Encounter (HOSPITAL_COMMUNITY): Payer: Self-pay | Admitting: Emergency Medicine

## 2023-09-04 ENCOUNTER — Emergency Department (HOSPITAL_COMMUNITY)
Admission: EM | Admit: 2023-09-04 | Discharge: 2023-09-04 | Disposition: A | Discharge status: Home / Self Care | Payer: Medicare Other | Attending: Emergency Medicine | Admitting: Emergency Medicine

## 2023-09-04 DIAGNOSIS — E860 Dehydration: Secondary | ICD-10-CM | POA: Diagnosis present

## 2023-09-04 DIAGNOSIS — S0990XA Unspecified injury of head, initial encounter: Secondary | ICD-10-CM | POA: Diagnosis not present

## 2023-09-04 DIAGNOSIS — R059 Cough, unspecified: Secondary | ICD-10-CM | POA: Diagnosis not present

## 2023-09-04 DIAGNOSIS — I213 ST elevation (STEMI) myocardial infarction of unspecified site: Secondary | ICD-10-CM | POA: Diagnosis not present

## 2023-09-04 DIAGNOSIS — Z885 Allergy status to narcotic agent status: Secondary | ICD-10-CM | POA: Diagnosis not present

## 2023-09-04 DIAGNOSIS — Z9049 Acquired absence of other specified parts of digestive tract: Secondary | ICD-10-CM | POA: Diagnosis not present

## 2023-09-04 DIAGNOSIS — J111 Influenza due to unidentified influenza virus with other respiratory manifestations: Secondary | ICD-10-CM | POA: Diagnosis not present

## 2023-09-04 DIAGNOSIS — W19XXXA Unspecified fall, initial encounter: Secondary | ICD-10-CM

## 2023-09-04 DIAGNOSIS — R55 Syncope and collapse: Secondary | ICD-10-CM | POA: Diagnosis present

## 2023-09-04 DIAGNOSIS — Z923 Personal history of irradiation: Secondary | ICD-10-CM | POA: Diagnosis not present

## 2023-09-04 DIAGNOSIS — Z20822 Contact with and (suspected) exposure to covid-19: Secondary | ICD-10-CM | POA: Insufficient documentation

## 2023-09-04 DIAGNOSIS — Y92009 Unspecified place in unspecified non-institutional (private) residence as the place of occurrence of the external cause: Secondary | ICD-10-CM | POA: Insufficient documentation

## 2023-09-04 DIAGNOSIS — R809 Proteinuria, unspecified: Secondary | ICD-10-CM | POA: Diagnosis present

## 2023-09-04 DIAGNOSIS — R61 Generalized hyperhidrosis: Secondary | ICD-10-CM | POA: Diagnosis not present

## 2023-09-04 DIAGNOSIS — W108XXA Fall (on) (from) other stairs and steps, initial encounter: Secondary | ICD-10-CM | POA: Insufficient documentation

## 2023-09-04 DIAGNOSIS — R42 Dizziness and giddiness: Secondary | ICD-10-CM | POA: Diagnosis not present

## 2023-09-04 DIAGNOSIS — R823 Hemoglobinuria: Secondary | ICD-10-CM | POA: Diagnosis present

## 2023-09-04 DIAGNOSIS — J09X2 Influenza due to identified novel influenza A virus with other respiratory manifestations: Secondary | ICD-10-CM | POA: Insufficient documentation

## 2023-09-04 DIAGNOSIS — Z1152 Encounter for screening for COVID-19: Secondary | ICD-10-CM | POA: Diagnosis not present

## 2023-09-04 DIAGNOSIS — Z902 Acquired absence of lung [part of]: Secondary | ICD-10-CM | POA: Diagnosis not present

## 2023-09-04 DIAGNOSIS — Z87891 Personal history of nicotine dependence: Secondary | ICD-10-CM | POA: Diagnosis not present

## 2023-09-04 DIAGNOSIS — J101 Influenza due to other identified influenza virus with other respiratory manifestations: Secondary | ICD-10-CM | POA: Diagnosis not present

## 2023-09-04 DIAGNOSIS — R531 Weakness: Secondary | ICD-10-CM | POA: Diagnosis not present

## 2023-09-04 DIAGNOSIS — Z85048 Personal history of other malignant neoplasm of rectum, rectosigmoid junction, and anus: Secondary | ICD-10-CM | POA: Diagnosis not present

## 2023-09-04 DIAGNOSIS — R000 Tachycardia, unspecified: Secondary | ICD-10-CM | POA: Diagnosis not present

## 2023-09-04 DIAGNOSIS — Z808 Family history of malignant neoplasm of other organs or systems: Secondary | ICD-10-CM | POA: Diagnosis not present

## 2023-09-04 DIAGNOSIS — S0081XA Abrasion of other part of head, initial encounter: Secondary | ICD-10-CM | POA: Insufficient documentation

## 2023-09-04 DIAGNOSIS — R0602 Shortness of breath: Secondary | ICD-10-CM | POA: Diagnosis not present

## 2023-09-04 DIAGNOSIS — A4189 Other specified sepsis: Secondary | ICD-10-CM | POA: Diagnosis present

## 2023-09-04 DIAGNOSIS — Z825 Family history of asthma and other chronic lower respiratory diseases: Secondary | ICD-10-CM | POA: Diagnosis not present

## 2023-09-04 DIAGNOSIS — Z789 Other specified health status: Secondary | ICD-10-CM | POA: Diagnosis not present

## 2023-09-04 DIAGNOSIS — Z83719 Family history of colon polyps, unspecified: Secondary | ICD-10-CM | POA: Diagnosis not present

## 2023-09-04 DIAGNOSIS — Z800 Family history of malignant neoplasm of digestive organs: Secondary | ICD-10-CM | POA: Diagnosis not present

## 2023-09-04 DIAGNOSIS — S00212A Abrasion of left eyelid and periocular area, initial encounter: Secondary | ICD-10-CM | POA: Insufficient documentation

## 2023-09-04 DIAGNOSIS — Z933 Colostomy status: Secondary | ICD-10-CM | POA: Diagnosis not present

## 2023-09-04 DIAGNOSIS — J984 Other disorders of lung: Secondary | ICD-10-CM | POA: Diagnosis not present

## 2023-09-04 DIAGNOSIS — R824 Acetonuria: Secondary | ICD-10-CM | POA: Diagnosis present

## 2023-09-04 DIAGNOSIS — Z85118 Personal history of other malignant neoplasm of bronchus and lung: Secondary | ICD-10-CM | POA: Diagnosis not present

## 2023-09-04 LAB — RESP PANEL BY RT-PCR (RSV, FLU A&B, COVID)  RVPGX2
Influenza A by PCR: POSITIVE — AB
Influenza B by PCR: NEGATIVE
Resp Syncytial Virus by PCR: NEGATIVE
SARS Coronavirus 2 by RT PCR: NEGATIVE

## 2023-09-04 LAB — COMPREHENSIVE METABOLIC PANEL
ALT: 17 U/L (ref 0–44)
AST: 27 U/L (ref 15–41)
Albumin: 3.7 g/dL (ref 3.5–5.0)
Alkaline Phosphatase: 35 U/L — ABNORMAL LOW (ref 38–126)
Anion gap: 10 (ref 5–15)
BUN: 20 mg/dL (ref 8–23)
CO2: 23 mmol/L (ref 22–32)
Calcium: 8.5 mg/dL — ABNORMAL LOW (ref 8.9–10.3)
Chloride: 100 mmol/L (ref 98–111)
Creatinine, Ser: 1.26 mg/dL — ABNORMAL HIGH (ref 0.61–1.24)
GFR, Estimated: 58 mL/min — ABNORMAL LOW (ref >60)
Glucose, Bld: 107 mg/dL — ABNORMAL HIGH (ref 70–99)
Potassium: 4.2 mmol/L (ref 3.5–5.1)
Sodium: 133 mmol/L — ABNORMAL LOW (ref 135–145)
Total Bilirubin: 1 mg/dL (ref 0.0–1.2)
Total Protein: 6.7 g/dL (ref 6.5–8.1)

## 2023-09-04 LAB — CBC WITH DIFFERENTIAL/PLATELET
Abs Immature Granulocytes: 0.04 10*3/uL (ref 0.00–0.07)
Basophils Absolute: 0 10*3/uL (ref 0.0–0.1)
Basophils Relative: 0 %
Eosinophils Absolute: 0 10*3/uL (ref 0.0–0.5)
Eosinophils Relative: 0 %
HCT: 35.1 % — ABNORMAL LOW (ref 39.0–52.0)
Hemoglobin: 11.4 g/dL — ABNORMAL LOW (ref 13.0–17.0)
Immature Granulocytes: 1 %
Lymphocytes Relative: 7 %
Lymphs Abs: 0.4 10*3/uL — ABNORMAL LOW (ref 0.7–4.0)
MCH: 29.8 pg (ref 26.0–34.0)
MCHC: 32.5 g/dL (ref 30.0–36.0)
MCV: 91.9 fL (ref 80.0–100.0)
Monocytes Absolute: 0.7 10*3/uL (ref 0.1–1.0)
Monocytes Relative: 11 %
Neutro Abs: 4.6 10*3/uL (ref 1.7–7.7)
Neutrophils Relative %: 81 %
Platelets: 328 10*3/uL (ref 150–400)
RBC: 3.82 MIL/uL — ABNORMAL LOW (ref 4.22–5.81)
RDW: 14.2 % (ref 11.5–15.5)
WBC: 5.7 10*3/uL (ref 4.0–10.5)
nRBC: 0 % (ref 0.0–0.2)

## 2023-09-04 MED ORDER — SODIUM CHLORIDE 0.9 % IV BOLUS
1000.0000 mL | Freq: Once | INTRAVENOUS | Status: AC
  Administered 2023-09-04: 1000 mL via INTRAVENOUS

## 2023-09-04 MED ORDER — ACETAMINOPHEN 500 MG PO TABS
1000.0000 mg | ORAL_TABLET | Freq: Once | ORAL | Status: AC
  Administered 2023-09-04: 1000 mg via ORAL
  Filled 2023-09-04: qty 2

## 2023-09-04 MED ORDER — OSELTAMIVIR PHOSPHATE 75 MG PO CAPS: 75.0000 mg | ORAL_CAPSULE | Freq: Two times a day (BID) | ORAL | 0 refills | Status: DC

## 2023-09-04 NOTE — ED Provider Notes (Signed)
[place] EMERGENCY DEPARTMENT AT [facility] Provider Note   CSN: [id number] Arrival date & time: 09/04/23  2007     History  Chief Complaint  Patient presents with   Jon Wallace is a 78 y.o. male.  78 year old male with prior medical history as detailed below presents for evaluation.  Patient reports that he was at home.  He was descending a flight of stairs.  He felt like he lost his balance and then fell.  He did strike his head.  He denies loss of conscious.  He denies neck pain.  Patient reports decreased p.o. intake over the last 2 to 3 days secondary to URI symptoms.  Patient denies fever.  Patient denies chest pain, shortness of breath, nausea, vomiting, hip pain, extremity injury.  The history is provided by the patient and medical records.       Home Medications Prior to Admission medications   Medication Sig Start Date End Date Taking? Authorizing Provider  acetaminophen (TYLENOL) 325 MG tablet Take 325 mg by mouth every 6 (six) hours as needed for mild pain. 04/23/19   [provider]  docusate sodium (STOOL SOFTENER) 100 MG capsule Take 100 mg by mouth 2 (two) times daily as needed for mild constipation.    [provider]  polyethylene glycol (MIRALAX / GLYCOLAX) 17 g packet Take 17 g by mouth daily as needed for mild constipation or moderate constipation. Patient not taking: Reported on 07/23/2022 09/23/21   Joycelyn Das, MD  traMADol-acetaminophen (ULTRACET) 37.5-325 MG tablet Take 1 tablet by mouth every 8 (eight) hours as needed. 09/28/22   Lynann Bologna, MD      Allergies    Morphine and codeine    Review of Systems   Review of Systems  All other systems reviewed and are negative.   Physical Exam Updated Vital Signs Ht 5\' 9"  (1.753 m)   Wt 59.9 kg   BMI 19.49 kg/m  Physical Exam Vitals and nursing note reviewed.  Constitutional:      General: He is not in acute distress.    Appearance: Normal appearance.  He is well-developed.  HENT:     Head: Normocephalic.     Comments: Superficial abrasion noted to forehead and left eyebrow Eyes:     Conjunctiva/sclera: Conjunctivae normal.     Pupils: Pupils are equal, round, and reactive to light.  Cardiovascular:     Rate and Rhythm: Normal rate and regular rhythm.     Heart sounds: Normal heart sounds.  Pulmonary:     Effort: Pulmonary effort is normal. No respiratory distress.     Breath sounds: Normal breath sounds.  Abdominal:     General: There is no distension.     Palpations: Abdomen is soft.     Tenderness: There is no abdominal tenderness.     Comments: Colostomy in place in the left lower quadrant  Musculoskeletal:        General: No deformity. Normal range of motion.     Cervical back: Normal range of motion and neck supple.  Skin:    General: Skin is warm and dry.  Neurological:     General: No focal deficit present.     Mental Status: He is alert and oriented to person, place, and time.     ED Results / Procedures / Treatments   Labs (all labs ordered are listed, but only abnormal results are displayed) Labs Reviewed  RESP PANEL BY RT-PCR (RSV, FLU  A&B, COVID)  [id number]  CBC WITH DIFFERENTIAL/PLATELET  COMPREHENSIVE METABOLIC PANEL  URINALYSIS, W/ REFLEX TO CULTURE (INFECTION SUSPECTED)    EKG None  Radiology No results found.  Procedures Procedures    Medications Ordered in ED Medications  sodium chloride 0.9 % bolus 1,000 mL (has no administration in time range)    ED Course/ Medical Decision Making/ A&P                                 Medical Decision Making Amount and/or Complexity of Data Reviewed Labs: ordered. Radiology: ordered.  Risk OTC drugs.    Medical Screen Complete  This patient presented to the ED with complaint of weakness, fall, head injury.  This complaint involves an extensive number of treatment options. The initial differential diagnosis includes, but is not limited to,  trauma from fall, metabolic abnormality, viral infection, etc.  This presentation is: Acute, Self-Limited, Previously Undiagnosed, Uncertain Prognosis, Complicated, Systemic Symptoms, and Threat to Life/Bodily Function  Patient presents from home after fall.  Patient reports malaise, fatigue, body aches x 2 to 3 days.  Patient reports that this evening when he was trying to ambulate he felt weak and then fell.  He did strike his head.  He denies LOC or neck pain.  Imaging is without evidence of acute traumatic pathology.  Patient is flu a positive.  Other screening labs obtained are without significant abnormality.  Patient is feeling improved after ED evaluation.  Appears to desire discharge home.  Ambulation trial obtained.  Patient ambulated without difficulty.  He still desires discharge home.  Importance of close follow-up is stressed.  Strict return precautions given and understood.  Additional history obtained:  External records from outside sources obtained and reviewed including prior ED visits and prior Inpatient records.    Lab Tests:  I ordered and personally interpreted labs.  The pertinent results include: CBC, CMP, COVID, flu, influenza   Imaging Studies ordered:  I ordered imaging studies including CT head, chest x-ray I independently visualized and interpreted obtained imaging which showed NAD I agree with the radiologist interpretation.   Cardiac Monitoring:  The patient was maintained on a cardiac monitor.  I personally viewed and interpreted the cardiac monitor which showed an underlying rhythm of: NSR   Medicines ordered:  I ordered medication including Tylenol, IV fluids for fever, suspected dehydration Reevaluation of the patient after these medicines showed that the patient: improved   Problem List / ED Course:  Fall, head injury, influenza   Reevaluation:  After the interventions noted above, I reevaluated the patient and found that they  have: improved   Disposition:  After consideration of the diagnostic results and the patients response to treatment, I feel that the patent would benefit from close outpatient follow-up.          Final Clinical Impression(s) / ED Diagnoses Final diagnoses:  Fall, initial encounter  Injury of head, initial encounter  Influenza    Rx / DC Orders ED Discharge Orders          Ordered    oseltamivir (TAMIFLU) 75 MG capsule  Every 12 hours        09/04/23 2241              Wynetta Fines, MD 09/04/23 2243

## 2023-09-04 NOTE — Discharge Instructions (Signed)
 Return for any problem.  ?

## 2023-09-04 NOTE — ED Triage Notes (Signed)
Pt BIB [facility] from home post fall down 4-5 steps, pt reports hitting head, denies LOC, not on thinners, recent cold-like symptoms with dizziness and decreased oral intake, +orthostatic changes, c-spine cleared by EMS, v/s en route 146/76, HR102, RR14, 96% RA, CBG 118

## 2023-09-05 ENCOUNTER — Emergency Department (HOSPITAL_COMMUNITY): Payer: Medicare Other

## 2023-09-05 ENCOUNTER — Encounter (HOSPITAL_COMMUNITY): Payer: Self-pay | Admitting: Student

## 2023-09-05 ENCOUNTER — Inpatient Hospital Stay (HOSPITAL_COMMUNITY)
Admission: EM | Admit: 2023-09-05 | Discharge: 2023-09-07 | DRG: 872 | Disposition: A | Discharge status: Home / Self Care | Payer: Medicare Other | Attending: Family Medicine | Admitting: Family Medicine

## 2023-09-05 DIAGNOSIS — R824 Acetonuria: Secondary | ICD-10-CM | POA: Diagnosis present

## 2023-09-05 DIAGNOSIS — Y92009 Unspecified place in unspecified non-institutional (private) residence as the place of occurrence of the external cause: Secondary | ICD-10-CM

## 2023-09-05 DIAGNOSIS — R61 Generalized hyperhidrosis: Secondary | ICD-10-CM | POA: Diagnosis not present

## 2023-09-05 DIAGNOSIS — Z800 Family history of malignant neoplasm of digestive organs: Secondary | ICD-10-CM

## 2023-09-05 DIAGNOSIS — J111 Influenza due to unidentified influenza virus with other respiratory manifestations: Secondary | ICD-10-CM

## 2023-09-05 DIAGNOSIS — R55 Syncope and collapse: Secondary | ICD-10-CM | POA: Diagnosis present

## 2023-09-05 DIAGNOSIS — R809 Proteinuria, unspecified: Secondary | ICD-10-CM | POA: Diagnosis present

## 2023-09-05 DIAGNOSIS — R531 Weakness: Principal | ICD-10-CM

## 2023-09-05 DIAGNOSIS — Z83719 Family history of colon polyps, unspecified: Secondary | ICD-10-CM

## 2023-09-05 DIAGNOSIS — Z1152 Encounter for screening for COVID-19: Secondary | ICD-10-CM

## 2023-09-05 DIAGNOSIS — Z923 Personal history of irradiation: Secondary | ICD-10-CM

## 2023-09-05 DIAGNOSIS — W19XXXA Unspecified fall, initial encounter: Secondary | ICD-10-CM

## 2023-09-05 DIAGNOSIS — Z85048 Personal history of other malignant neoplasm of rectum, rectosigmoid junction, and anus: Secondary | ICD-10-CM

## 2023-09-05 DIAGNOSIS — A4189 Other specified sepsis: Principal | ICD-10-CM | POA: Diagnosis present

## 2023-09-05 DIAGNOSIS — Z85118 Personal history of other malignant neoplasm of bronchus and lung: Secondary | ICD-10-CM

## 2023-09-05 DIAGNOSIS — E860 Dehydration: Secondary | ICD-10-CM | POA: Diagnosis present

## 2023-09-05 DIAGNOSIS — Z825 Family history of asthma and other chronic lower respiratory diseases: Secondary | ICD-10-CM

## 2023-09-05 DIAGNOSIS — Z885 Allergy status to narcotic agent status: Secondary | ICD-10-CM

## 2023-09-05 DIAGNOSIS — Z933 Colostomy status: Secondary | ICD-10-CM

## 2023-09-05 DIAGNOSIS — Z808 Family history of malignant neoplasm of other organs or systems: Secondary | ICD-10-CM

## 2023-09-05 DIAGNOSIS — R0602 Shortness of breath: Secondary | ICD-10-CM | POA: Diagnosis not present

## 2023-09-05 DIAGNOSIS — Z87891 Personal history of nicotine dependence: Secondary | ICD-10-CM

## 2023-09-05 DIAGNOSIS — Z902 Acquired absence of lung [part of]: Secondary | ICD-10-CM

## 2023-09-05 DIAGNOSIS — J101 Influenza due to other identified influenza virus with other respiratory manifestations: Secondary | ICD-10-CM | POA: Diagnosis present

## 2023-09-05 DIAGNOSIS — Z9049 Acquired absence of other specified parts of digestive tract: Secondary | ICD-10-CM

## 2023-09-05 DIAGNOSIS — R823 Hemoglobinuria: Secondary | ICD-10-CM | POA: Diagnosis present

## 2023-09-05 DIAGNOSIS — Z789 Other specified health status: Secondary | ICD-10-CM

## 2023-09-05 LAB — URINALYSIS, W/ REFLEX TO CULTURE (INFECTION SUSPECTED)
Bacteria, UA: NONE SEEN
Bilirubin Urine: NEGATIVE
Glucose, UA: NEGATIVE mg/dL
Ketones, ur: 5 mg/dL — AB
Leukocytes,Ua: NEGATIVE
Nitrite: NEGATIVE
Protein, ur: 30 mg/dL — AB
Specific Gravity, Urine: 1.019 (ref 1.005–1.030)
pH: 5 (ref 5.0–8.0)

## 2023-09-05 LAB — COMPREHENSIVE METABOLIC PANEL
ALT: 17 U/L (ref 0–44)
AST: 34 U/L (ref 15–41)
Albumin: 3.2 g/dL — ABNORMAL LOW (ref 3.5–5.0)
Alkaline Phosphatase: 28 U/L — ABNORMAL LOW (ref 38–126)
Anion gap: 7 (ref 5–15)
BUN: 21 mg/dL (ref 8–23)
CO2: 21 mmol/L — ABNORMAL LOW (ref 22–32)
Calcium: 7.4 mg/dL — ABNORMAL LOW (ref 8.9–10.3)
Chloride: 103 mmol/L (ref 98–111)
Creatinine, Ser: 1.04 mg/dL (ref 0.61–1.24)
GFR, Estimated: >60 mL/min (ref >60)
Glucose, Bld: 112 mg/dL — ABNORMAL HIGH (ref 70–99)
Potassium: 3.7 mmol/L (ref 3.5–5.1)
Sodium: 131 mmol/L — ABNORMAL LOW (ref 135–145)
Total Bilirubin: 0.8 mg/dL (ref 0.0–1.2)
Total Protein: 5.9 g/dL — ABNORMAL LOW (ref 6.5–8.1)

## 2023-09-05 LAB — CBC WITH DIFFERENTIAL/PLATELET
Abs Immature Granulocytes: 0.04 10*3/uL (ref 0.00–0.07)
Basophils Absolute: 0 10*3/uL (ref 0.0–0.1)
Basophils Relative: 0 %
Eosinophils Absolute: 0 10*3/uL (ref 0.0–0.5)
Eosinophils Relative: 0 %
HCT: 32.1 % — ABNORMAL LOW (ref 39.0–52.0)
Hemoglobin: 10.7 g/dL — ABNORMAL LOW (ref 13.0–17.0)
Immature Granulocytes: 1 %
Lymphocytes Relative: 6 %
Lymphs Abs: 0.4 10*3/uL — ABNORMAL LOW (ref 0.7–4.0)
MCH: 30.3 pg (ref 26.0–34.0)
MCHC: 33.3 g/dL (ref 30.0–36.0)
MCV: 90.9 fL (ref 80.0–100.0)
Monocytes Absolute: 0.6 10*3/uL (ref 0.1–1.0)
Monocytes Relative: 8 %
Neutro Abs: 6.6 10*3/uL (ref 1.7–7.7)
Neutrophils Relative %: 85 %
Platelets: 276 10*3/uL (ref 150–400)
RBC: 3.53 MIL/uL — ABNORMAL LOW (ref 4.22–5.81)
RDW: 14.2 % (ref 11.5–15.5)
WBC: 7.7 10*3/uL (ref 4.0–10.5)
nRBC: 0 % (ref 0.0–0.2)

## 2023-09-05 LAB — PHOSPHORUS: Phosphorus: 3.1 mg/dL (ref 2.5–4.6)

## 2023-09-05 LAB — I-STAT CG4 LACTIC ACID, ED: Lactic Acid, Venous: 0.9 mmol/L (ref 0.5–1.9)

## 2023-09-05 LAB — VITAMIN B12: Vitamin B-12: <50 pg/mL — ABNORMAL LOW (ref 180–914)

## 2023-09-05 LAB — VITAMIN D 25 HYDROXY (VIT D DEFICIENCY, FRACTURES): Vit D, 25-Hydroxy: 10.67 ng/mL — ABNORMAL LOW (ref 30–100)

## 2023-09-05 LAB — MAGNESIUM: Magnesium: 2.2 mg/dL (ref 1.7–2.4)

## 2023-09-05 MED ORDER — DM-GUAIFENESIN ER 30-600 MG PO TB12
1.0000 | ORAL_TABLET | Freq: Two times a day (BID) | ORAL | Status: DC
  Administered 2023-09-05 – 2023-09-07 (×4): 1 via ORAL
  Filled 2023-09-05 (×4): qty 1

## 2023-09-05 MED ORDER — OSELTAMIVIR PHOSPHATE 30 MG PO CAPS
30.0000 mg | ORAL_CAPSULE | Freq: Two times a day (BID) | ORAL | Status: DC
  Administered 2023-09-05: 30 mg via ORAL
  Filled 2023-09-05 (×2): qty 1

## 2023-09-05 MED ORDER — ACETAMINOPHEN 325 MG PO TABS
650.0000 mg | ORAL_TABLET | Freq: Four times a day (QID) | ORAL | Status: DC | PRN
  Administered 2023-09-06: 650 mg via ORAL
  Filled 2023-09-05: qty 2

## 2023-09-05 MED ORDER — SENNOSIDES-DOCUSATE SODIUM 8.6-50 MG PO TABS: 1.0000 | ORAL_TABLET | Freq: Every evening | ORAL | Status: DC | PRN

## 2023-09-05 MED ORDER — OSELTAMIVIR PHOSPHATE 75 MG PO CAPS
75.0000 mg | ORAL_CAPSULE | Freq: Once | ORAL | Status: AC
  Administered 2023-09-05: 75 mg via ORAL
  Filled 2023-09-05: qty 1

## 2023-09-05 MED ORDER — SODIUM CHLORIDE 0.9 % IV BOLUS
1000.0000 mL | Freq: Once | INTRAVENOUS | Status: AC
  Administered 2023-09-05: 1000 mL via INTRAVENOUS

## 2023-09-05 MED ORDER — ACETAMINOPHEN 650 MG RE SUPP: 650.0000 mg | Freq: Four times a day (QID) | RECTAL | Status: DC | PRN

## 2023-09-05 MED ORDER — IPRATROPIUM-ALBUTEROL 0.5-2.5 (3) MG/3ML IN SOLN: 3.0000 mL | Freq: Four times a day (QID) | RESPIRATORY_TRACT | Status: DC | PRN

## 2023-09-05 MED ORDER — ENOXAPARIN SODIUM 40 MG/0.4ML IJ SOSY
40.0000 mg | PREFILLED_SYRINGE | INTRAMUSCULAR | Status: DC
  Administered 2023-09-05 – 2023-09-06 (×2): 40 mg via SUBCUTANEOUS
  Filled 2023-09-05 (×2): qty 0.4

## 2023-09-05 MED ORDER — ONDANSETRON HCL 4 MG/2ML IJ SOLN: 4.0000 mg | Freq: Four times a day (QID) | INTRAMUSCULAR | Status: DC | PRN

## 2023-09-05 MED ORDER — SODIUM CHLORIDE 0.9 % IV SOLN: INTRAVENOUS | Status: AC

## 2023-09-05 MED ORDER — ONDANSETRON HCL 4 MG PO TABS: 4.0000 mg | ORAL_TABLET | Freq: Four times a day (QID) | ORAL | Status: DC | PRN

## 2023-09-05 MED ORDER — ENSURE ENLIVE PO LIQD
237.0000 mL | Freq: Three times a day (TID) | ORAL | Status: DC
  Administered 2023-09-05 – 2023-09-06 (×2): 237 mL via ORAL

## 2023-09-05 NOTE — ED Provider Notes (Signed)
[facility] AT [facility] Provider Note   CSN: [id number] Arrival date & time: 09/05/23  1251     History  Chief Complaint  Patient presents with   Weakness    Jon Wallace is a 78 y.o. male.  78 year old male with prior medical history as detailed below presents for evaluation.  Patient was seen yesterday by same provider for similar issues.  Yesterday patient was diagnosed with acute influenza and associated weakness.  Patient felt significantly improved yesterday evening after his ED evaluation.  He preferred discharge home.  He reports that this morning he was too weak to even get out of bed.  He arrives by EMS with complaint of continued and worsening profound generalized weakness with associated fever.  EMS administered 650 of Tylenol p.o. during transport.  Patient did not have a chance to fill prescription for Tamiflu provided yesterday evening.  The history is provided by the patient, medical records and the EMS personnel.       Home Medications Prior to Admission medications   Medication Sig Start Date End Date Taking? Authorizing Provider  acetaminophen (TYLENOL) 325 MG tablet Take 325 mg by mouth every 6 (six) hours as needed for mild pain. 04/23/19   [provider]  docusate sodium (STOOL SOFTENER) 100 MG capsule Take 100 mg by mouth 2 (two) times daily as needed for mild constipation.    [provider]  oseltamivir (TAMIFLU) 75 MG capsule Take 1 capsule (75 mg total) by mouth every 12 (twelve) hours. 09/04/23   Wynetta Fines, MD  polyethylene glycol (MIRALAX / GLYCOLAX) 17 g packet Take 17 g by mouth daily as needed for mild constipation or moderate constipation. Patient not taking: Reported on 07/23/2022 09/23/21   Joycelyn Das, MD  traMADol-acetaminophen (ULTRACET) 37.5-325 MG tablet Take 1 tablet by mouth every 8 (eight) hours as needed. 09/28/22   Lynann Bologna, MD      Allergies    Morphine and codeine     Review of Systems   Review of Systems  All other systems reviewed and are negative.   Physical Exam Updated Vital Signs BP 119/69 (BP Location: Right Arm)   Pulse (!) 102   Temp (!) 103.1 F (39.5 C) (Oral)   Resp 20   SpO2 95%  Physical Exam Vitals and nursing note reviewed.  Constitutional:      General: He is not in acute distress.    Appearance: Normal appearance. He is well-developed.  HENT:     Head: Normocephalic.     Comments: Superficial abrasions to forehead and left eyebrow from fall that occurred yesterday.    Mouth/Throat:     Mouth: Mucous membranes are dry.  Eyes:     Conjunctiva/sclera: Conjunctivae normal.     Pupils: Pupils are equal, round, and reactive to light.  Cardiovascular:     Rate and Rhythm: Normal rate and regular rhythm.     Heart sounds: Normal heart sounds.  Pulmonary:     Effort: Pulmonary effort is normal. No respiratory distress.     Breath sounds: Normal breath sounds.  Abdominal:     General: There is no distension.     Palpations: Abdomen is soft.     Tenderness: There is no abdominal tenderness.  Musculoskeletal:        General: No deformity. Normal range of motion.     Cervical back: Normal range of motion and neck supple.  Skin:    General: Skin is warm  and dry.  Neurological:     General: No focal deficit present.     Mental Status: He is alert and oriented to person, place, and time.     ED Results / Procedures / Treatments   Labs (all labs ordered are listed, but only abnormal results are displayed) Labs Reviewed  CULTURE, BLOOD (ROUTINE X 2)  CULTURE, BLOOD (ROUTINE X 2)  CBC WITH DIFFERENTIAL/PLATELET  COMPREHENSIVE METABOLIC PANEL  URINALYSIS, W/ REFLEX TO CULTURE (INFECTION SUSPECTED)  I-STAT CG4 LACTIC ACID, ED    EKG EKG Interpretation Date/Time:  Sunday September 05 2023 13:17:06 EST Ventricular Rate:  99 PR Interval:  139 QRS Duration:  84 QT Interval:  317 QTC Calculation: 407 R  Axis:   49  Text Interpretation: Sinus rhythm Probable left atrial enlargement Confirmed by Kristine Royal [phone]) on 09/05/2023 1:25:44 PM  Radiology CT Head Wo Contrast Result Date: 09/04/2023 CLINICAL DATA:  Fall down stairs, Head trauma, minor (Age >= 65y) EXAM: CT HEAD WITHOUT CONTRAST TECHNIQUE: Contiguous axial images were obtained from the base of the skull through the vertex without intravenous contrast. RADIATION DOSE REDUCTION: This exam was performed according to the departmental dose-optimization program which includes automated exposure control, adjustment of the mA and/or kV according to patient size and/or use of iterative reconstruction technique. COMPARISON:  None Available. FINDINGS: Brain: Normal anatomic configuration. Parenchymal volume loss is commensurate with the patient's age. Mild periventricular white matter changes are present likely reflecting the sequela of small vessel ischemia. No abnormal intra or extra-axial mass lesion or fluid collection. No abnormal mass effect or midline shift. No evidence of acute intracranial hemorrhage or infarct. Ventricular size is normal. Cerebellum unremarkable. Vascular: No asymmetric hyperdense vasculature at the skull base. Skull: Intact Sinuses/Orbits: Paranasal sinuses are clear. Orbits are unremarkable. Other: Mastoid air cells and middle ear cavities are clear. IMPRESSION: 1. No acute intracranial hemorrhage or infarct. No calvarial fracture. 2. Mild senescent change. Electronically Signed   By: Helyn Numbers M.D.   On: 09/04/2023 21:18   DG Chest Port 1 View Result Date: 09/04/2023 CLINICAL DATA:  Cough. EXAM: PORTABLE CHEST 1 VIEW COMPARISON:  04/21/2023 FINDINGS: Heart size and mediastinal contours are unremarkable. Postoperative changes with suture chains identified in the right lower lung. Similar appearance of biapical pleuroparenchymal scarring. No superimposed pleural effusion, interstitial edema or airspace consolidation. The  visualized osseous structures are unremarkable. IMPRESSION: 1. No acute cardiopulmonary abnormalities. 2. Postoperative changes in the right lower lung. 3. Biapical pleuroparenchymal scarring. Electronically Signed   By: Signa Kell M.D.   On: 09/04/2023 20:36    Procedures Procedures    Medications Ordered in ED Medications  sodium chloride 0.9 % bolus 1,000 mL (has no administration in time range)  oseltamivir (TAMIFLU) capsule 75 mg (75 mg Oral Given 09/05/23 1339)    ED Course/ Medical Decision Making/ A&P                                 Medical Decision Making Amount and/or Complexity of Data Reviewed Labs: ordered. Radiology: ordered.  Risk Prescription drug management. Decision regarding hospitalization.    Medical Screen Complete  This patient presented to the ED with complaint of weakness.  This complaint involves an extensive number of treatment options. The initial differential diagnosis includes, but is not limited to, viral infection, metabolic abnormality, dehydration, etc.  This presentation is: Acute, Self-Limited, Previously Undiagnosed, Uncertain Prognosis, Complicated, Systemic Symptoms, and Threat  to Life/Bodily Function  Patient seen yesterday after ground-level fall.  Traumatic workup and evaluation yesterday did not reveal significant injuries.  Patient however was flu positive.  After workup and evaluation yesterday patient felt improved and desired discharge home.  This morning the patient awoke and was profoundly weak to the point where he could not ambulate.  He returned by EMS.  Patient's presentation is consistent with acute influenza infection.  Patient would benefit from admission given his profound weakness.  Hospitalist service is aware of case and will evaluate for same.    Additional history obtained: External records from outside sources obtained and reviewed including prior ED visits and prior Inpatient records.    Lab  Tests:  I ordered and personally interpreted labs.  The pertinent results include: CBC, CMP, UA, lactic acid   Imaging Studies ordered:  I ordered imaging studies including chest x-ray I independently visualized and interpreted obtained imaging which showed NAD I agree with the radiologist interpretation.   Cardiac Monitoring:  The patient was maintained on a cardiac monitor.  I personally viewed and interpreted the cardiac monitor which showed an underlying rhythm of: Sinus tach then NSR   Medicines ordered:  I ordered medication including Tamiflu for acute influenza Reevaluation of the patient after these medicines showed that the patient: stayed the same  Problem List / ED Course:  Weakness, acute influenza infection   Reevaluation:  After the interventions noted above, I reevaluated the patient and found that they have: improved  Disposition:  After consideration of the diagnostic results and the patients response to treatment, I feel that the patent would benefit from admission.          Final Clinical Impression(s) / ED Diagnoses Final diagnoses:  Weakness  Influenza    Rx / DC Orders ED Discharge Orders     None         Wynetta Fines, MD 09/05/23 1521

## 2023-09-05 NOTE — Plan of Care (Signed)
   Problem: Education: Goal: Knowledge of General Education information will improve Description: Including pain rating scale, medication(s)/side effects and non-pharmacologic comfort measures Outcome: Progressing   Problem: Clinical Measurements: Goal: Ability to maintain clinical measurements within normal limits will improve Outcome: Progressing Goal: Will remain free from infection Outcome: Progressing

## 2023-09-05 NOTE — H&P (Signed)
History and Physical  Jon Wallace ZOX:[id number] DOB: [birth year] DOA: 09/05/2023  PCP: [facility] [place]   Chief Complaint: Flu infection, fall, generalized weakness  HPI: Jon Wallace is a 78 y.o. male with medical history significant for lung cancer, colon cancer s/p colectomy with colostomy and small bowel obstruction who presents to the ED for evaluation of generalized weakness and fall.  Per daughter, patient's spouse has been sick with the flu for the last 5 days.  On Friday, patient started having cold and cough symptoms. Yesterday, while descending down flights of stairs, he lost his balance and fell hitting the left side of his head.  He was presented to the ED for further evaluation.  CT head did not reveal any abnormalities however patient was found to have influenza A infection.  He was able to ambulate on his own and felt strong enough to go home to rest so he was discharged from the ED with a prescription of Tamiflu. This morning, patient got up to change his colostomy and was unable to stand off from a kneeling position due to significant weakness so he fell again on his side.  Patient reports feeling weak and dizzy after waking up this morning.  He also endorsed fevers, chills and dry cough but denies any vision changes, headaches, chest pain, abdominal pain, nausea, vomiting, constipation or dysuria.  ED Course: Initial vitals show fever of 103.1 and HR 102 but otherwise stable.  UA showed moderate hemoglobinuria, mild ketonuria and proteinuria but no signs of infection.  No leukocytosis, normal lactic acid.  Patient was given IV NS 1 L bolus and Tamiflu 75 mg in the ED. [facility] was consulted for admission  Review of Systems: Please see HPI for pertinent positives and negatives. A complete 10 system review of systems are otherwise negative.  Past Medical History:  Diagnosis Date   Cataract    RIGHT eye sx   Colon cancer (HCC)    2003   Lung cancer (HCC)     2011   Past Surgical History:  Procedure Laterality Date   COLON SURGERY  2003   permanent colostomy   COLON SURGERY  2021   colon sx to correct scar tissue/blockages   COLOSTOMY  2003   permanent colostomy placed due to colon CA   INGUINAL HERNIA REPAIR Left 1951   KNEE ARTHROSCOPY W/ MENISCAL REPAIR Left    TONSILLECTOMY     WISDOM TOOTH EXTRACTION     Social History:  reports that he has quit smoking. He has never used smokeless tobacco. He reports that he does not currently use alcohol. He reports that he does not currently use drugs.  Allergies  Allergen Reactions   Morphine And Codeine Nausea And Vomiting    Family History  Problem Relation Age of Onset   Colon polyps Father 53   COPD Father    Throat cancer Father 14   Throat cancer Sister 63   Thyroid cancer Sister 59       and jaw cancer   Colon cancer Paternal Uncle    Stomach cancer Cousin    Pancreatic cancer Neg Hx      Prior to Admission medications   Medication Sig Start Date End Date Taking? Authorizing Provider  acetaminophen (TYLENOL) 325 MG tablet Take 325 mg by mouth every 6 (six) hours as needed for mild pain. 04/23/19  Yes [provider]  oseltamivir (TAMIFLU) 75 MG capsule Take 1 capsule (75 mg total) by  mouth every 12 (twelve) hours. 09/04/23  Yes Wynetta Fines, MD  docusate sodium (STOOL SOFTENER) 100 MG capsule Take 100 mg by mouth 2 (two) times daily as needed for mild constipation. Patient not taking: Reported on 09/05/2023    [provider]    Physical Exam: BP 115/73 (BP Location: Right Arm)   Pulse 93   Temp 99.5 F (37.5 C) (Oral)   Resp 14   SpO2 95%  General: Pleasant, tired-appearing elderly male laying in bed. No acute distress. HEENT: [place]/AT. Anicteric sclera.  Dry mucous membrane CV: Mild tachycardia.  Regular rhythm. No murmurs, rubs, or gallops. No LE edema Pulmonary: Lungs CTAB. Normal effort. No wheezing or rales. Abdominal: Soft, nontender,  nondistended. Normal bowel sounds. Colostomy bag stable in place with brownish stool. Extremities: Palpable radial and DP pulses. Normal ROM. Skin: Warm and dry. No obvious rash or lesions. Neuro: A&Ox3. Moves all extremities. Normal sensation to light touch. No focal deficit. Psych: Normal mood and affect          Labs on Admission:  Basic Metabolic Panel: Recent Labs  Lab 09/04/23 2033 09/05/23 1412  NA 133* 131*  K 4.2 3.7  CL 100 103  CO2 23 21*  GLUCOSE 107* 112*  BUN 20 21  CREATININE 1.26* 1.04  CALCIUM 8.5* 7.4*   Liver Function Tests: Recent Labs  Lab 09/04/23 2033 09/05/23 1412  AST 27 34  ALT 17 17  ALKPHOS 35* 28*  BILITOT 1.0 0.8  PROT 6.7 5.9*  ALBUMIN 3.7 3.2*   No results for input(s): "LIPASE", "AMYLASE" in the last 168 hours. No results for input(s): "AMMONIA" in the last 168 hours. CBC: Recent Labs  Lab 09/04/23 2033 09/05/23 1412  WBC 5.7 7.7  NEUTROABS 4.6 6.6  HGB 11.4* 10.7*  HCT 35.1* 32.1*  MCV 91.9 90.9  PLT 328 276   Cardiac Enzymes: No results for input(s): "CKTOTAL", "CKMB", "CKMBINDEX", "TROPONINI" in the last 168 hours. BNP (last 3 results) No results for input(s): "BNP" in the last 8760 hours.  ProBNP (last 3 results) No results for input(s): "PROBNP" in the last 8760 hours.  CBG: No results for input(s): "GLUCAP" in the last 168 hours.  Radiological Exams on Admission: DG Chest Port 1 View Result Date: 09/05/2023 CLINICAL DATA:  Shortness of breath EXAM: PORTABLE CHEST 1 VIEW COMPARISON:  09/04/2023 FINDINGS: The heart size and mediastinal contours are within normal limits. Stable aeration of both lungs. Biapical pleuroparenchymal scarring. Suture line in the right lower hemithorax. No pleural effusion or pneumothorax. The visualized skeletal structures are unremarkable. IMPRESSION: No active disease. Electronically Signed   By: Duanne Guess D.O.   On: 09/05/2023 13:42   CT Head Wo Contrast Result Date:  09/04/2023 CLINICAL DATA:  Fall down stairs, Head trauma, minor (Age >= 65y) EXAM: CT HEAD WITHOUT CONTRAST TECHNIQUE: Contiguous axial images were obtained from the base of the skull through the vertex without intravenous contrast. RADIATION DOSE REDUCTION: This exam was performed according to the departmental dose-optimization program which includes automated exposure control, adjustment of the mA and/or kV according to patient size and/or use of iterative reconstruction technique. COMPARISON:  None Available. FINDINGS: Brain: Normal anatomic configuration. Parenchymal volume loss is commensurate with the patient's age. Mild periventricular white matter changes are present likely reflecting the sequela of small vessel ischemia. No abnormal intra or extra-axial mass lesion or fluid collection. No abnormal mass effect or midline shift. No evidence of acute intracranial hemorrhage or infarct. Ventricular size is  normal. Cerebellum unremarkable. Vascular: No asymmetric hyperdense vasculature at the skull base. Skull: Intact Sinuses/Orbits: Paranasal sinuses are clear. Orbits are unremarkable. Other: Mastoid air cells and middle ear cavities are clear. IMPRESSION: 1. No acute intracranial hemorrhage or infarct. No calvarial fracture. 2. Mild senescent change. Electronically Signed   By: Helyn Numbers M.D.   On: 09/04/2023 21:18   DG Chest Port 1 View Result Date: 09/04/2023 CLINICAL DATA:  Cough. EXAM: PORTABLE CHEST 1 VIEW COMPARISON:  04/21/2023 FINDINGS: Heart size and mediastinal contours are unremarkable. Postoperative changes with suture chains identified in the right lower lung. Similar appearance of biapical pleuroparenchymal scarring. No superimposed pleural effusion, interstitial edema or airspace consolidation. The visualized osseous structures are unremarkable. IMPRESSION: 1. No acute cardiopulmonary abnormalities. 2. Postoperative changes in the right lower lung. 3. Biapical pleuroparenchymal scarring.  Electronically Signed   By: Signa Kell M.D.   On: 09/04/2023 20:36   Assessment/Plan Jon Wallace is a 78 y.o. male with medical history significant for lung cancer, colon cancer s/p colectomy with colostomy and small bowel obstruction who was recently diagnosed with influenza A infection and returns to the ED for evaluation of generalized weakness and fall.   # Mechanical fall # Generalized weakness Patient with recent influenza A infection presenting to the ED after 2 falls in the last 2 days in the setting of significant weakness and dehydration from his respiratory infection. -Manage respiratory infection as below -Check mag, Phos, vitamin D and vitamin B levels -IV NS at 100 cc/h -Protein supplementation -PT/OT eval -Fall precautions  # Influenza A infection Patient with sick contact at home found to have positive influenza A test during his ED visit yesterday. He continues to have fevers and chills with occasional dry cough. He has been significantly fatigue leading to repeated fall and return to the ED for further evaluation. CXR does not show any evidence of pneumonia. Remains on room air with appropriate O2 sats. -Tamiflu 30 mg twice daily based on creatinine clearance of 48.8 -Mucinex DM twice daily -Follow-up blood culture -As needed DuoNebs -Incentive spirometer, flutter valve -Supplemental O2 as needed -Droplet precautions  # Colon cancer s/p colectomy with end colostomy Complicated by repeated SBO's. Per daughter, patient's colostomy was changed earlier today at home.  No abdominal pain, constipation, nausea or vomiting. -Continue colostomy care  DVT prophylaxis: Lovenox     Code Status: Full Code  Consults called: None  Family Communication: Discussed admission with daughter at bedside  Severity of Illness: The appropriate patient status for this patient is OBSERVATION. Observation status is judged to be reasonable and necessary in order to provide the  required intensity of service to ensure the patient's safety. The patient's presenting symptoms, physical exam findings, and initial radiographic and laboratory data in the context of their medical condition is felt to place them at decreased risk for further clinical deterioration. Furthermore, it is anticipated that the patient will be medically stable for discharge from the hospital within 2 midnights of admission.   Level of care:  MedSurg  Steffanie Rainwater, MD 09/05/2023, 3:22 PM [facility] Pager: [phone] Isaiah 41:10   If 7PM-7AM, please contact night-coverage [email or web address] Password [email or web address]

## 2023-09-05 NOTE — ED Triage Notes (Addendum)
Pt BIBA from home for weakness, wasn't able to change ostomy or get up this morning. Had dc last night after being diagnosed with the flu. No falls, no head trauma. 18ga LAC, 300cc NS. 650 tylenol.  140/80 HR 105

## 2023-09-05 NOTE — ED Notes (Signed)
Called to give [facility] heads up that pt is being transported.

## 2023-09-06 DIAGNOSIS — Z9049 Acquired absence of other specified parts of digestive tract: Secondary | ICD-10-CM | POA: Diagnosis not present

## 2023-09-06 DIAGNOSIS — R809 Proteinuria, unspecified: Secondary | ICD-10-CM | POA: Diagnosis present

## 2023-09-06 DIAGNOSIS — R531 Weakness: Secondary | ICD-10-CM | POA: Diagnosis present

## 2023-09-06 DIAGNOSIS — R824 Acetonuria: Secondary | ICD-10-CM | POA: Diagnosis present

## 2023-09-06 DIAGNOSIS — R823 Hemoglobinuria: Secondary | ICD-10-CM | POA: Diagnosis present

## 2023-09-06 DIAGNOSIS — Z808 Family history of malignant neoplasm of other organs or systems: Secondary | ICD-10-CM | POA: Diagnosis not present

## 2023-09-06 DIAGNOSIS — Z83719 Family history of colon polyps, unspecified: Secondary | ICD-10-CM | POA: Diagnosis not present

## 2023-09-06 DIAGNOSIS — A4189 Other specified sepsis: Secondary | ICD-10-CM | POA: Diagnosis present

## 2023-09-06 DIAGNOSIS — E860 Dehydration: Secondary | ICD-10-CM | POA: Diagnosis present

## 2023-09-06 DIAGNOSIS — R55 Syncope and collapse: Secondary | ICD-10-CM | POA: Diagnosis present

## 2023-09-06 DIAGNOSIS — Z933 Colostomy status: Secondary | ICD-10-CM | POA: Diagnosis not present

## 2023-09-06 DIAGNOSIS — Z85048 Personal history of other malignant neoplasm of rectum, rectosigmoid junction, and anus: Secondary | ICD-10-CM | POA: Diagnosis not present

## 2023-09-06 DIAGNOSIS — Z87891 Personal history of nicotine dependence: Secondary | ICD-10-CM | POA: Diagnosis not present

## 2023-09-06 DIAGNOSIS — Z800 Family history of malignant neoplasm of digestive organs: Secondary | ICD-10-CM | POA: Diagnosis not present

## 2023-09-06 DIAGNOSIS — Z85118 Personal history of other malignant neoplasm of bronchus and lung: Secondary | ICD-10-CM | POA: Diagnosis not present

## 2023-09-06 DIAGNOSIS — Z902 Acquired absence of lung [part of]: Secondary | ICD-10-CM | POA: Diagnosis not present

## 2023-09-06 DIAGNOSIS — Z825 Family history of asthma and other chronic lower respiratory diseases: Secondary | ICD-10-CM | POA: Diagnosis not present

## 2023-09-06 DIAGNOSIS — Z923 Personal history of irradiation: Secondary | ICD-10-CM | POA: Diagnosis not present

## 2023-09-06 DIAGNOSIS — Z885 Allergy status to narcotic agent status: Secondary | ICD-10-CM | POA: Diagnosis not present

## 2023-09-06 DIAGNOSIS — Z789 Other specified health status: Secondary | ICD-10-CM | POA: Diagnosis not present

## 2023-09-06 DIAGNOSIS — Z1152 Encounter for screening for COVID-19: Secondary | ICD-10-CM | POA: Diagnosis not present

## 2023-09-06 DIAGNOSIS — J101 Influenza due to other identified influenza virus with other respiratory manifestations: Secondary | ICD-10-CM | POA: Diagnosis present

## 2023-09-06 LAB — CBC
HCT: 35.5 % — ABNORMAL LOW (ref 39.0–52.0)
Hemoglobin: 11.5 g/dL — ABNORMAL LOW (ref 13.0–17.0)
MCH: 29.9 pg (ref 26.0–34.0)
MCHC: 32.4 g/dL (ref 30.0–36.0)
MCV: 92.4 fL (ref 80.0–100.0)
Platelets: 278 10*3/uL (ref 150–400)
RBC: 3.84 MIL/uL — ABNORMAL LOW (ref 4.22–5.81)
RDW: 14.3 % (ref 11.5–15.5)
WBC: 7.4 10*3/uL (ref 4.0–10.5)
nRBC: 0 % (ref 0.0–0.2)

## 2023-09-06 LAB — BASIC METABOLIC PANEL
Anion gap: 9 (ref 5–15)
BUN: 19 mg/dL (ref 8–23)
CO2: 22 mmol/L (ref 22–32)
Calcium: 8.2 mg/dL — ABNORMAL LOW (ref 8.9–10.3)
Chloride: 104 mmol/L (ref 98–111)
Creatinine, Ser: 1.1 mg/dL (ref 0.61–1.24)
GFR, Estimated: >60 mL/min (ref >60)
Glucose, Bld: 93 mg/dL (ref 70–99)
Potassium: 3.9 mmol/L (ref 3.5–5.1)
Sodium: 135 mmol/L (ref 135–145)

## 2023-09-06 MED ORDER — OSELTAMIVIR PHOSPHATE 75 MG PO CAPS
75.0000 mg | ORAL_CAPSULE | Freq: Two times a day (BID) | ORAL | Status: DC
  Administered 2023-09-06 – 2023-09-07 (×3): 75 mg via ORAL
  Filled 2023-09-06 (×3): qty 1

## 2023-09-06 NOTE — Progress Notes (Signed)
[facility]   NOTE BURTIS IMHOFF ION:[id number]  DOB: Jan 12, 1946  DOA: 09/05/2023  PCP: [facility] [place]  09/06/2023,7:59 AM   LOS: 0 days      Code Status: Full code From: Home  current Dispo: Likely home   78 year old male known cataracts Previous SBO with LOA 2020 Dr. 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 Prior PSBO managed 2023 as well as 01/18/2023 without surgery  Events 2/1 came to ED after falling down 4-5 steps cold-like symptoms of dizziness orthostatic--- seen by ED and sent home with Tamiflu  2/2 return to [facility] ED difficult to get out of bed profound general weakness cough cold symptoms Tmax 103.1 heart rate 100s given bolus Tamiflu Sodium 131 BUN/creatinine 21/1.0 close to baseline LFTs relatively normal WBC 7 hemoglobin 10  procedures 2/1 CT head no acute intracranial hemorrhage CXR   no active disease   Plan  Syncope with fall Fall was accidental and he felt "dizzy"--I think he was slightly volume depleted on arrival and we will continue IVF but at  a lower rate of maybe 50 cc/h Nursing aware to ambulate him around the unit several x's today and hopeflly he can d/c in am Supportive mucinex1 tab bid/duonebs temporarily for now Sepsis 2/2 Influenza A infection Continue Tamiflu 75 bid--fluids as above No concern for bacterial superinfection, would monitor one might night given high grade fever on admit and then d/c fluids Previous rectal cancer with resection 2003, wedge resection lung 2011 OP follow up  DVT prophylaxis: lovenox  Status is: Observation The patient remains OBS appropriate and will d/c before 2 midnights.  Subjective: Awake coherent in nad no focal deficit Looks well feels fair but still lightheaded--has never happened before No fever chills  Objective + exam Vitals:   09/06/23 0021 09/06/23 0153 09/06/23 0238 09/06/23 0638  BP:  139/78  117/75 125/78  Pulse: 96  84 85  Resp: 18  17 17   Temp: (!) 102.3 F (39.1 C) 99.3 F (37.4 C) 98.8 F (37.1 C) 99.2 F (37.3 C)  TempSrc:  Oral Oral Oral  SpO2: 96%  95% 97%  Weight:      Height:       Filed Weights   09/05/23 1638  Weight: 96.4 kg    Examination: Awake coherent in nad no focal deficit Cta b no added sound no wheeze rales rhonchi Abd soft--Ostomy in place with moderatestool No le edema  Data Reviewed: reviewed   CBC    Component Value Date/Time   WBC 7.4 09/06/2023 0418   RBC 3.84 (L) 09/06/2023 0418   HGB 11.5 (L) 09/06/2023 0418   HCT 35.5 (L) 09/06/2023 0418   PLT 278 09/06/2023 0418   MCV 92.4 09/06/2023 0418   MCV 94.8 05/14/2012 1138   MCH 29.9 09/06/2023 0418   MCHC 32.4 09/06/2023 0418   RDW 14.3 09/06/2023 0418   LYMPHSABS 0.4 (L) 09/05/2023 1412   MONOABS 0.6 09/05/2023 1412   EOSABS 0.0 09/05/2023 1412   BASOSABS 0.0 09/05/2023 1412      Latest Ref Rng & Units 09/06/2023    4:18 AM 09/05/2023    2:12 PM 09/04/2023    8:33 PM  CMP  Glucose 70 - 99 mg/dL 93  244  010   BUN 8 - 23 mg/dL 19  21  20    Creatinine 0.61 - 1.24 mg/dL 2.72  5.36  6.44  Sodium 135 - 145 mmol/L 135  131  133   Potassium 3.5 - 5.1 mmol/L 3.9  3.7  4.2   Chloride 98 - 111 mmol/L 104  103  100   CO2 22 - 32 mmol/L 22  21  23    Calcium 8.9 - 10.3 mg/dL 8.2  7.4  8.5   Total Protein 6.5 - 8.1 g/dL  5.9  6.7   Total Bilirubin 0.0 - 1.2 mg/dL  0.8  1.0   Alkaline Phos 38 - 126 U/L  28  35   AST 15 - 41 U/L  34  27   ALT 0 - 44 U/L  17  17     Scheduled Meds:  dextromethorphan-guaiFENesin  1 tablet Oral BID   enoxaparin (LOVENOX) injection  40 mg Subcutaneous Q24H   feeding supplement  237 mL Oral TID BM   oseltamivir  75 mg Oral BID   Continuous Infusions:  sodium chloride 100 mL/hr at 09/05/23 1731    Time  35  Rhetta Mura, MD  [facility]

## 2023-09-06 NOTE — Care Management Obs Status (Signed)
MEDICARE OBSERVATION STATUS NOTIFICATION   Patient Details  Name: Jon Wallace MRN: [id number] Date of Birth: November 28, 1945   Medicare Observation Status Notification Given:  Yes    Beckie Busing, RN 09/06/2023, 10:13 AM

## 2023-09-06 NOTE — Evaluation (Addendum)
Physical Therapy Evaluation Patient Details Name: Jon Wallace MRN: [id number] DOB: May 06, 1946 Today's Date: 09/06/2023  History of Present Illness  78 y.o. male who was recently diagnosed with influenza A infection and returns to the ED for evaluation of generalized weakness and fall. Pt with medical history significant for lung cancer, colon cancer s/p colectomy with colostomy and small bowel obstruction  Clinical Impression  Pt is mobilizing well, he ambulated 350' holding IV pole, no loss of balance, SpO2 95% on room air, no dyspnea. Encouraged pt use a cane at home for extra support until he feels he's returned to baseline with mobility. He stated he can borrow a cane from his daughter. From a PT standpoint, he is ready to DC home. No further acute PT indicated, PT signing off, mobility team to follow.          If plan is discharge home, recommend the following:     Can travel by private vehicle        Equipment Recommendations None recommended by PT  Recommendations for Other Services       Functional Status Assessment Patient has not had a recent decline in their functional status     Precautions / Restrictions Precautions Precautions: Fall Precaution Comments: fell PTA Restrictions Weight Bearing Restrictions Per Provider Order: No      Mobility  Bed Mobility               General bed mobility comments: up in recliner    Transfers Overall transfer level: Modified independent Equipment used: None                    Ambulation/Gait Ambulation/Gait assistance: Supervision Gait Distance (Feet): 350 Feet Assistive device: IV Pole Gait Pattern/deviations: WFL(Within Functional Limits) Gait velocity: WFL     General Gait Details: steady, no loss of balance holding IV pole, with trial of no IV pole pt stated he felt less steady so prefered to hold IV pole; SpO2 95% on room air walking, no loss of balance  Stairs            Wheelchair  Mobility     Tilt Bed    Modified Rankin (Stroke Patients Only)       Balance Overall balance assessment: No apparent balance deficits (not formally assessed)                                           Pertinent Vitals/Pain Pain Assessment Pain Score: 0-No pain    Home Living Family/patient expects to be discharged to:: Private residence Living Arrangements: Spouse/significant other Available Help at Discharge: Family;Available PRN/intermittently Type of Home: House Home Access: Stairs to enter Entrance Stairs-Rails: None Entrance Stairs-Number of Steps: 4 Alternate Level Stairs-Number of Steps: 15 Home Layout: Two level;Bed/bath upstairs Home Equipment: None      Prior Function Prior Level of Function : Independent/Modified Independent;Driving             Mobility Comments: walks without AD; 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 ADLs Comments: independent     Extremity/Trunk Assessment   Upper Extremity Assessment Upper Extremity Assessment: Defer to OT evaluation    Lower Extremity Assessment Lower Extremity Assessment: Overall WFL for tasks assessed    Cervical / Trunk Assessment Cervical / Trunk Assessment: Normal  Communication   Communication Communication: No apparent difficulties  Cognition Arousal: Alert Behavior During Therapy: WFL for tasks assessed/performed Overall Cognitive Status: Within Functional Limits for tasks assessed                                          General Comments General comments (skin integrity, edema, etc.): skin tear noted R posterior shoulder and L eyebrow    Exercises     Assessment/Plan    PT Assessment Patient does not need any further PT services  PT Problem List         PT Treatment Interventions Gait training;Balance training    PT Goals (Current goals can be found in the Care Plan section)  Acute Rehab PT Goals Patient Stated Goal: resume  painting and caring for wife PT Goal Formulation: All assessment and education complete, DC therapy    Frequency Min 1X/week     Co-evaluation               AM-PAC PT "6 Clicks" Mobility  Outcome Measure Help needed turning from your back to your side while in a flat bed without using bedrails?: None Help needed moving from lying on your back to sitting on the side of a flat bed without using bedrails?: None Help needed moving to and from a bed to a chair (including a wheelchair)?: None Help needed standing up from a chair using your arms (e.g., wheelchair or bedside chair)?: None Help needed to walk in hospital room?: None Help needed climbing 3-5 steps with a railing? : None 6 Click Score: 24    End of Session   Activity Tolerance: Patient tolerated treatment well Patient left: in chair;with call bell/phone within reach Nurse Communication: Mobility status      Time: 1610-9604 PT Time Calculation (min) (ACUTE ONLY): 22 min   Charges:   PT Evaluation $PT Eval Low Complexity: 1 Low   PT General Charges $$ ACUTE PT VISIT: 1 Visit         Tamala Ser PT 09/06/2023  Acute Rehabilitation Services  Office [phone]

## 2023-09-06 NOTE — Plan of Care (Signed)

## 2023-09-06 NOTE — Evaluation (Signed)
Occupational Therapy Evaluation Patient Details Name: Jon Wallace MRN: [id number] DOB: 11-21-1945 Today's Date: 09/06/2023   History of Present Illness 78 y.o. male who was recently diagnosed with influenza A infection and returns to the ED for evaluation of generalized weakness and fall. Pt with medical history significant for lung cancer, colon cancer s/p colectomy with colostomy and small bowel obstruction   Clinical Impression   Patient evaluated by Occupational Therapy with no further acute OT needs identified. All education has been completed and the patient has no further questions. Patient endorsed not needing OT at this time.  See below for any follow-up Occupational Therapy or equipment needs. OT is signing off. Thank you for this referral.        If plan is discharge home, recommend the following:      Functional Status Assessment  Patient has not had a recent decline in their functional status  Equipment Recommendations  None recommended by OT    Recommendations for Other Services       Precautions / Restrictions Restrictions Weight Bearing Restrictions Per Provider Order: No      Mobility Bed Mobility Overal bed mobility: Modified Independent                  Transfers Overall transfer level: Modified independent                        Balance Overall balance assessment: No apparent balance deficits (not formally assessed)                                         ADL either performed or assessed with clinical judgement   ADL Overall ADL's : Modified independent                                       General ADL Comments: patient was able to preform bed mobility, transfers, functional mobility in room, standing at sink for grooming tasks and LB dressing with MI. OT to sign off. patient endorsed not needing OT at this time.     Vision Baseline Vision/History: 1 Wears glasses Additional Comments: all  times but reported can drive without them            Pertinent Vitals/Pain Pain Assessment Pain Assessment: Faces Faces Pain Scale: Hurts a little bit Pain Location: R shoulder Pain Descriptors / Indicators: Discomfort Pain Intervention(s): Limited activity within patient's tolerance, Monitored during session     Extremity/Trunk Assessment Upper Extremity Assessment Upper Extremity Assessment: Overall WFL for tasks assessed;Left hand dominant   Lower Extremity Assessment Lower Extremity Assessment: Defer to PT evaluation   Cervical / Trunk Assessment Cervical / Trunk Assessment: Normal      Cognition Arousal: Alert Behavior During Therapy: WFL for tasks assessed/performed Overall Cognitive Status: Within Functional Limits for tasks assessed                 General Comments  noted to have skin tear on posterior shoulder near scapular spine on R side and on L eyebrow    Exercises     Shoulder Instructions      Home Living Family/patient expects to be discharged to:: Private residence Living Arrangements: Spouse/significant other Available Help at Discharge: Family;Available PRN/intermittently Type of Home: House Home Access: Stairs  to enter Entrance Stairs-Number of Steps: 4 Entrance Stairs-Rails: None Home Layout: Two level;Bed/bath upstairs Alternate Level Stairs-Number of Steps: 15   Bathroom Shower/Tub: Tub/shower unit         Home Equipment: None          Prior Functioning/Environment Prior Level of Function : Independent/Modified Independent                        OT Problem List:        OT Treatment/Interventions:      OT Goals(Current goals can be found in the care plan section) Acute Rehab OT Goals OT Goal Formulation: All assessment and education complete, DC therapy  OT Frequency:         AM-PAC OT "6 Clicks" Daily Activity     Outcome Measure Help from another person eating meals?: None Help from another person taking  care of personal grooming?: None Help from another person toileting, which includes using toliet, bedpan, or urinal?: None Help from another person bathing (including washing, rinsing, drying)?: None Help from another person to put on and taking off regular upper body clothing?: None Help from another person to put on and taking off regular lower body clothing?: None 6 Click Score: 24   End of Session    Activity Tolerance: Patient tolerated treatment well Patient left: in chair;with call bell/phone within reach (with PT in room)  OT Visit Diagnosis: Unsteadiness on feet (R26.81)                Time: 1050-1107 OT Time Calculation (min): 17 min Charges:  OT General Charges $OT Visit: 1 Visit OT Evaluation $OT Eval Low Complexity: 1 Low  [name] OTR/L, MS Acute Rehabilitation Department Office# [phone]   [name] 09/06/2023, 11:20 AM

## 2023-09-06 NOTE — Progress Notes (Signed)
PHARMACY NOTE:  ANTIMICROBIAL RENAL DOSAGE ADJUSTMENT  Current antimicrobial regimen includes a mismatch between antimicrobial dosage and estimated renal function.  As per policy approved by the Pharmacy & Therapeutics and Medical Executive Committees, the antimicrobial dosage will be adjusted accordingly.  Current antimicrobial dosage:  Tamiflu 30mg  po BID  Indication: +Influenza A  Renal Function:  Estimated Creatinine Clearance: 62.4 mL/min (by C-G formula based on SCr of 1.1 mg/dL). []      On intermittent HD, scheduled: []      On CRRT    Antimicrobial dosage has been changed to:  Tamiflu 75mg  PO BID  Additional comments:   Thank you for allowing pharmacy to be a part of this patient's care.  Junita Push, [facility] 09/06/2023 6:15 AM

## 2023-09-07 DIAGNOSIS — R531 Weakness: Secondary | ICD-10-CM | POA: Diagnosis not present

## 2023-09-07 LAB — CBC WITH DIFFERENTIAL/PLATELET
Abs Immature Granulocytes: 0.02 10*3/uL (ref 0.00–0.07)
Basophils Absolute: 0 10*3/uL (ref 0.0–0.1)
Basophils Relative: 0 %
Eosinophils Absolute: 0 10*3/uL (ref 0.0–0.5)
Eosinophils Relative: 0 %
HCT: 34 % — ABNORMAL LOW (ref 39.0–52.0)
Hemoglobin: 11.1 g/dL — ABNORMAL LOW (ref 13.0–17.0)
Immature Granulocytes: 0 %
Lymphocytes Relative: 20 %
Lymphs Abs: 1.1 10*3/uL (ref 0.7–4.0)
MCH: 29.6 pg (ref 26.0–34.0)
MCHC: 32.6 g/dL (ref 30.0–36.0)
MCV: 90.7 fL (ref 80.0–100.0)
Monocytes Absolute: 0.5 10*3/uL (ref 0.1–1.0)
Monocytes Relative: 9 %
Neutro Abs: 4 10*3/uL (ref 1.7–7.7)
Neutrophils Relative %: 71 %
Platelets: 266 10*3/uL (ref 150–400)
RBC: 3.75 MIL/uL — ABNORMAL LOW (ref 4.22–5.81)
RDW: 14 % (ref 11.5–15.5)
WBC: 5.7 10*3/uL (ref 4.0–10.5)
nRBC: 0 % (ref 0.0–0.2)

## 2023-09-07 LAB — BASIC METABOLIC PANEL
Anion gap: 9 (ref 5–15)
BUN: 16 mg/dL (ref 8–23)
CO2: 23 mmol/L (ref 22–32)
Calcium: 8.1 mg/dL — ABNORMAL LOW (ref 8.9–10.3)
Chloride: 101 mmol/L (ref 98–111)
Creatinine, Ser: 1.04 mg/dL (ref 0.61–1.24)
GFR, Estimated: >60 mL/min (ref >60)
Glucose, Bld: 116 mg/dL — ABNORMAL HIGH (ref 70–99)
Potassium: 3.6 mmol/L (ref 3.5–5.1)
Sodium: 133 mmol/L — ABNORMAL LOW (ref 135–145)

## 2023-09-07 MED ORDER — DM-GUAIFENESIN ER 30-600 MG PO TB12: 1.0000 | ORAL_TABLET | Freq: Two times a day (BID) | ORAL | 0 refills | Status: DC

## 2023-09-07 NOTE — Discharge Summary (Signed)
 Physician Discharge Summary  Jon Wallace FMW:[id number] DOB: 1946-03-28 DOA: 09/05/2023  PCP: [facility] [place]  Admit date: 09/05/2023 Discharge date: 09/07/2023  Time spent: 26 minutes  Recommendations for Outpatient Follow-up:  Complete Tamiflu  in the outpatient setting empiric dextromethorphan  prescribed Needs labs in 1 week x-ray in 4 weeks  Discharge Diagnoses:  MAIN problem for hospitalization   Syncope and acute hypoxic respiratory failure secondary to H. influenzae  Please see below for itemized issues addressed in HOpsital- refer to other progress notes for clarity if needed  Discharge Condition: Improved  Diet recommendation: Regular  Filed Weights   09/05/23 1638  Weight: 96.4 kg    History of present illness:   78 year old male known cataracts Previous SBO with LOA 2020 Dr. 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 Prior PSBO managed 2023 as well as 01/18/2023 without surgery   Events 2/1 came to ED after falling down 4-5 steps cold-like symptoms of dizziness orthostatic--- seen by ED and sent home with Tamiflu   2/2 return to [facility] ED difficult to get out of bed profound general weakness cough cold symptoms Tmax 103.1 heart rate 100s given bolus Tamiflu  Sodium 131 BUN/creatinine 21/1.0 close to baseline LFTs relatively normal WBC 7 hemoglobin 10   procedures 2/1 CT head no acute intracranial hemorrhage CXR   no active disease     Plan   Syncope with fall Fall was accidental and he felt dizzy--he was volume depleted on admission and this resolved at discharge with IV fluid and he was able to ambulate without any issue He will need labs in about a week and he is encouraged to go slowly with ambulation Supportive mucinex1 tab in the outpatient setting AKI on admit leading to volume depletion syncope Completely resolved at discharge Sepsis 2/2  Influenza A infection Continue Tamiflu  75 bid-as an outpatient he should only require 1 or 2 more doses-fluids as above No concern for bacterial superinfection no high-grade fevers and discharged home Previous rectal cancer with resection 2003, wedge resection lung 2011 OP follow up and other surveillance as per his oncologist    Discharge Exam: Vitals:   09/06/23 1942 09/07/23 0402  BP: 118/76 119/70  Pulse: 87 85  Resp: 16 16  Temp: 98.6 F (37 C) 99.5 F (37.5 C)  SpO2: 98% 97%    Subj on day of d/c   Looks well feels fair no distress EOMI NCAT no focal deficit ROM intact CTAB no added sound S1-S2 no murmur Ostomy in place    Discharge Instructions   Discharge Instructions     Diet - low sodium heart healthy   Complete by: As directed    Discharge instructions   Complete by: As directed    Make sure you follow-up with your usual physician in about 1 to 2 weeks and get some labs and an x-ray in about a month I will continue Tylenol  for fever above 100.5 and take plenty of rest and drink fluids with electrolytes in them You can complete Tamiflu  but I would only take 2 more doses when you get home and you should take your time getting up and moving around Best of luck   Increase activity slowly   Complete by: As directed       Allergies as of 09/07/2023       Reactions   Morphine And Codeine Nausea And Vomiting  Medication List     TAKE these medications    acetaminophen  325 MG tablet Commonly known as: TYLENOL  Take 325 mg by mouth every 6 (six) hours as needed for mild pain.   dextromethorphan -guaiFENesin  30-600 MG 12hr tablet Commonly known as: MUCINEX  DM Take 1 tablet by mouth 2 (two) times daily.   oseltamivir  75 MG capsule Commonly known as: TAMIFLU  Take 1 capsule (75 mg total) by mouth every 12 (twelve) hours.   Stool Softener 100 MG capsule Generic drug: docusate sodium  Take 100 mg by mouth 2 (two) times daily as needed for mild  constipation.       Allergies  Allergen Reactions   Morphine And Codeine Nausea And Vomiting      The results of significant diagnostics from this hospitalization (including imaging, microbiology, ancillary and laboratory) are listed below for reference.    Significant Diagnostic Studies: DG Chest Port 1 View Result Date: 09/05/2023 CLINICAL DATA:  Shortness of breath EXAM: PORTABLE CHEST 1 VIEW COMPARISON:  09/04/2023 FINDINGS: The heart size and mediastinal contours are within normal limits. Stable aeration of both lungs. Biapical pleuroparenchymal scarring. Suture line in the right lower hemithorax. No pleural effusion or pneumothorax. The visualized skeletal structures are unremarkable. IMPRESSION: No active disease. Electronically Signed   By: Mabel Converse D.O.   On: 09/05/2023 13:42   CT Head Wo Contrast Result Date: 09/04/2023 CLINICAL DATA:  Fall down stairs, Head trauma, minor (Age >= 65y) EXAM: CT HEAD WITHOUT CONTRAST TECHNIQUE: Contiguous axial images were obtained from the base of the skull through the vertex without intravenous contrast. RADIATION DOSE REDUCTION: This exam was performed according to the departmental dose-optimization program which includes automated exposure control, adjustment of the mA and/or kV according to patient size and/or use of iterative reconstruction technique. COMPARISON:  None Available. FINDINGS: Brain: Normal anatomic configuration. Parenchymal volume loss is commensurate with the patient's age. Mild periventricular white matter changes are present likely reflecting the sequela of small vessel ischemia. No abnormal intra or extra-axial mass lesion or fluid collection. No abnormal mass effect or midline shift. No evidence of acute intracranial hemorrhage or infarct. Ventricular size is normal. Cerebellum unremarkable. Vascular: No asymmetric hyperdense vasculature at the skull base. Skull: Intact Sinuses/Orbits: Paranasal sinuses are clear. Orbits are  unremarkable. Other: Mastoid air cells and middle ear cavities are clear. IMPRESSION: 1. No acute intracranial hemorrhage or infarct. No calvarial fracture. 2. Mild senescent change. Electronically Signed   By: Dorethia Molt M.D.   On: 09/04/2023 21:18   DG Chest Port 1 View Result Date: 09/04/2023 CLINICAL DATA:  Cough. EXAM: PORTABLE CHEST 1 VIEW COMPARISON:  04/21/2023 FINDINGS: Heart size and mediastinal contours are unremarkable. Postoperative changes with suture chains identified in the right lower lung. Similar appearance of biapical pleuroparenchymal scarring. No superimposed pleural effusion, interstitial edema or airspace consolidation. The visualized osseous structures are unremarkable. IMPRESSION: 1. No acute cardiopulmonary abnormalities. 2. Postoperative changes in the right lower lung. 3. Biapical pleuroparenchymal scarring. Electronically Signed   By: Waddell Calk M.D.   On: 09/04/2023 20:36    Microbiology: Recent Results (from the past 240 hours)  Resp panel by RT-PCR (RSV, Flu A&B, Covid) Anterior Nasal Swab     Status: Abnormal   Collection Time: 09/04/23  8:33 PM   Specimen: Anterior Nasal Swab  Result Value Ref Range Status   SARS Coronavirus 2 by RT PCR NEGATIVE NEGATIVE Final    Comment: (NOTE) SARS-CoV-2 target nucleic acids are NOT DETECTED.  The SARS-CoV-2 RNA is  generally detectable in upper respiratory specimens during the acute phase of infection. The lowest concentration of SARS-CoV-2 viral copies this assay can detect is 138 copies/mL. A negative result does not preclude SARS-Cov-2 infection and should not be used as the sole basis for treatment or other patient management decisions. A negative result may occur with  improper specimen collection/handling, submission of specimen other than nasopharyngeal swab, presence of viral mutation(s) within the areas targeted by this assay, and inadequate number of viral copies(<138 copies/mL). A negative result must be  combined with clinical observations, patient history, and epidemiological information. The expected result is Negative.  Fact Sheet for Patients:  bloggercourse.com  Fact Sheet for Healthcare Providers:  seriousbroker.it  This test is no t yet approved or cleared by the [place]  FDA and  has been authorized for detection and/or diagnosis of SARS-CoV-2 by FDA under an Emergency Use Authorization (EUA). This EUA will remain  in effect (meaning this test can be used) for the duration of the COVID-19 declaration under Section 564(b)(1) of the Act, 21 U.S.C.section 360bbb-3(b)(1), unless the authorization is terminated  or revoked sooner.       Influenza A by PCR POSITIVE (A) NEGATIVE Final   Influenza B by PCR NEGATIVE NEGATIVE Final    Comment: (NOTE) The Xpert Xpress SARS-CoV-2/FLU/RSV plus assay is intended as an aid in the diagnosis of influenza from Nasopharyngeal swab specimens and should not be used as a sole basis for treatment. Nasal washings and aspirates are unacceptable for Xpert Xpress SARS-CoV-2/FLU/RSV testing.  Fact Sheet for Patients: bloggercourse.com  Fact Sheet for Healthcare Providers: seriousbroker.it  This test is not yet approved or cleared by the [place]  FDA and has been authorized for detection and/or diagnosis of SARS-CoV-2 by FDA under an Emergency Use Authorization (EUA). This EUA will remain in effect (meaning this test can be used) for the duration of the COVID-19 declaration under Section 564(b)(1) of the Act, 21 U.S.C. section 360bbb-3(b)(1), unless the authorization is terminated or revoked.     Resp Syncytial Virus by PCR NEGATIVE NEGATIVE Final    Comment: (NOTE) Fact Sheet for Patients: bloggercourse.com  Fact Sheet for Healthcare Providers: seriousbroker.it  This test is not  yet approved or cleared by the [place]  FDA and has been authorized for detection and/or diagnosis of SARS-CoV-2 by FDA under an Emergency Use Authorization (EUA). This EUA will remain in effect (meaning this test can be used) for the duration of the COVID-19 declaration under Section 564(b)(1) of the Act, 21 U.S.C. section 360bbb-3(b)(1), unless the authorization is terminated or revoked.  Performed at [facility], [address] [place], [place] [address]   Culture, blood (routine x 2)     Status: None (Preliminary result)   Collection Time: 09/05/23  2:12 PM   Specimen: BLOOD RIGHT ARM  Result Value Ref Range Status   Specimen Description   Final    BLOOD RIGHT ARM Performed at [facility], [address] [place], [place] [address]    Special Requests   Final    BOTTLES DRAWN AEROBIC AND ANAEROBIC Blood Culture adequate volume Performed at [facility], [address], [place], [place] [address]    Culture   Final    NO GROWTH 2 DAYS Performed at [facility], [address] [place], [place] [address]    Report Status PENDING  Incomplete  Culture, blood (routine x 2)     Status: None (Preliminary result)  Collection Time: 09/05/23  2:12 PM   Specimen: BLOOD LEFT ARM  Result Value Ref Range Status   Specimen Description   Final    BLOOD LEFT ARM Performed at [facility], [address] [place], [place] [address]    Special Requests   Final    BOTTLES DRAWN AEROBIC AND ANAEROBIC Blood Culture results may not be optimal due to an inadequate volume of blood received in culture bottles Performed at [facility], [address] [place], [place] [address]    Culture   Final    NO GROWTH 2 DAYS Performed at [facility], [address] [place], [place] [address]    Report Status PENDING  Incomplete     Labs: Basic Metabolic Panel: Recent Labs  Lab 09/04/23 2033 09/05/23 1412  09/05/23 1704 09/06/23 0418 09/07/23 0440  NA 133* 131*  --  135 133*  K 4.2 3.7  --  3.9 3.6  CL 100 103  --  104 101  CO2 23 21*  --  22 23  GLUCOSE 107* 112*  --  93 116*  BUN 20 21  --  19 16  CREATININE 1.26* 1.04  --  1.10 1.04  CALCIUM 8.5* 7.4*  --  8.2* 8.1*  MG  --   --  2.2  --   --   PHOS  --   --  3.1  --   --    Liver Function Tests: Recent Labs  Lab 09/04/23 2033 09/05/23 1412  AST 27 34  ALT 17 17  ALKPHOS 35* 28*  BILITOT 1.0 0.8  PROT 6.7 5.9*  ALBUMIN 3.7 3.2*   No results for input(s): LIPASE, AMYLASE in the last 168 hours. No results for input(s): AMMONIA in the last 168 hours. CBC: Recent Labs  Lab 09/04/23 2033 09/05/23 1412 09/06/23 0418 09/07/23 0440  WBC 5.7 7.7 7.4 5.7  NEUTROABS 4.6 6.6  --  4.0  HGB 11.4* 10.7* 11.5* 11.1*  HCT 35.1* 32.1* 35.5* 34.0*  MCV 91.9 90.9 92.4 90.7  PLT 328 276 278 266   Cardiac Enzymes: No results for input(s): CKTOTAL, CKMB, CKMBINDEX, TROPONINI in the last 168 hours. BNP: BNP (last 3 results) No results for input(s): BNP in the last 8760 hours.  ProBNP (last 3 results) No results for input(s): PROBNP in the last 8760 hours.  CBG: No results for input(s): GLUCAP in the last 168 hours.  Signed:  Colen Grimes MD   [facility] 09/07/2023, 9:03 AM

## 2023-09-10 LAB — CULTURE, BLOOD (ROUTINE X 2)
Culture: NO GROWTH
Culture: NO GROWTH
Special Requests: ADEQUATE

## 2023-09-20 DIAGNOSIS — N179 Acute kidney failure, unspecified: Secondary | ICD-10-CM | POA: Diagnosis not present

## 2023-09-20 DIAGNOSIS — J101 Influenza due to other identified influenza virus with other respiratory manifestations: Secondary | ICD-10-CM | POA: Diagnosis not present

## 2023-09-20 DIAGNOSIS — Z0000 Encounter for general adult medical examination without abnormal findings: Secondary | ICD-10-CM | POA: Diagnosis not present

## 2023-09-20 DIAGNOSIS — Z1331 Encounter for screening for depression: Secondary | ICD-10-CM | POA: Diagnosis not present

## 2023-09-20 DIAGNOSIS — Z23 Encounter for immunization: Secondary | ICD-10-CM | POA: Diagnosis not present

## 2023-09-20 DIAGNOSIS — D649 Anemia, unspecified: Secondary | ICD-10-CM | POA: Diagnosis not present

## 2023-09-20 DIAGNOSIS — Z1159 Encounter for screening for other viral diseases: Secondary | ICD-10-CM | POA: Diagnosis not present

## 2023-09-20 DIAGNOSIS — Z6820 Body mass index (BMI) 20.0-20.9, adult: Secondary | ICD-10-CM | POA: Diagnosis not present

## 2023-09-24 ENCOUNTER — Telehealth: Payer: Self-pay | Admitting: Gastroenterology

## 2023-09-24 NOTE — Telephone Encounter (Signed)
 Hi Dr. Chales Abrahams,  Patient called to schedule a recall colonoscopy that looks like is over due. We also received a referral from his PCP for a colonoscopy. Please advise on scheduling.     Thank you

## 2023-09-28 ENCOUNTER — Encounter: Payer: Self-pay | Admitting: Gastroenterology

## 2023-09-28 NOTE — Telephone Encounter (Signed)
 Although patient is 50 He has a history of rectal cancer Significant polyp on previous colonoscopy  Plan: -Can do colonoscopy directly with me -If negative, would not recommend any further colonoscopies d/t age -I would be happy to see him in office if he wants OV prior  RG

## 2023-09-29 ENCOUNTER — Encounter: Payer: Self-pay | Admitting: Gastroenterology

## 2023-09-29 DIAGNOSIS — E538 Deficiency of other specified B group vitamins: Secondary | ICD-10-CM | POA: Diagnosis not present

## 2023-09-29 DIAGNOSIS — E559 Vitamin D deficiency, unspecified: Secondary | ICD-10-CM | POA: Diagnosis not present

## 2023-09-29 DIAGNOSIS — Z6820 Body mass index (BMI) 20.0-20.9, adult: Secondary | ICD-10-CM | POA: Diagnosis not present

## 2023-09-29 DIAGNOSIS — D500 Iron deficiency anemia secondary to blood loss (chronic): Secondary | ICD-10-CM | POA: Diagnosis not present

## 2023-09-29 DIAGNOSIS — R7303 Prediabetes: Secondary | ICD-10-CM | POA: Diagnosis not present

## 2023-10-18 ENCOUNTER — Telehealth: Payer: Self-pay

## 2023-10-18 NOTE — Telephone Encounter (Signed)
 Called home number and mobile number, no answer.  Left message that he needed to call back and reschedule the pre visit by end of day today or his colonoscopy would be cancelled

## 2023-10-20 ENCOUNTER — Ambulatory Visit (AMBULATORY_SURGERY_CENTER)

## 2023-10-20 VITALS — Ht 68.5 in | Wt 140.0 lb

## 2023-10-20 DIAGNOSIS — Z85038 Personal history of other malignant neoplasm of large intestine: Secondary | ICD-10-CM

## 2023-10-20 DIAGNOSIS — Z85048 Personal history of other malignant neoplasm of rectum, rectosigmoid junction, and anus: Secondary | ICD-10-CM

## 2023-10-20 NOTE — Patient Instructions (Signed)
 [facility] has implemented a new process for scheduling procedures.  Please note your arrival time for the [facility] is your appointment time that is shown on your written instructions.  Please do not arrive one hour prior to the time listed in your instructions.  Please ignore any outside notifications to arrive one hour early.  We apologize for any confusion and look forward to seeing you for your procedure.

## 2023-10-20 NOTE — Progress Notes (Signed)
 No egg or soy allergy known to patient  No issues known to pt with past sedation with any surgeries or procedures Patient denies ever being told they had issues or difficulty with intubation  No FH of Malignant Hyperthermia Pt is not on diet pills Pt is not on  home 02  Pt is not on blood thinners  Pt denies issues with constipation  No A fib or A flutter Have any cardiac testing pending--no  LOA: independent Prep: spilt dose miralax   Patient's chart reviewed by Cathlyn Parsons CNRA prior to previsit and patient appropriate for the [facility].  Previsit completed and red dot placed by patient's name on their procedure day (on provider's schedule).     PV completed with patient. Prep instructions sent via mychart and home address.

## 2023-11-01 DIAGNOSIS — E7800 Pure hypercholesterolemia, unspecified: Secondary | ICD-10-CM | POA: Diagnosis not present

## 2023-11-01 DIAGNOSIS — Z85048 Personal history of other malignant neoplasm of rectum, rectosigmoid junction, and anus: Secondary | ICD-10-CM | POA: Diagnosis not present

## 2023-11-17 ENCOUNTER — Telehealth: Payer: Self-pay | Admitting: Gastroenterology

## 2023-11-17 ENCOUNTER — Ambulatory Visit: Payer: Medicare Other | Admitting: Gastroenterology

## 2023-11-17 ENCOUNTER — Encounter: Payer: Self-pay | Admitting: Gastroenterology

## 2023-11-17 VITALS — BP 118/82 | HR 70 | Temp 98.1°F | Resp 18 | Ht 68.5 in | Wt 140.0 lb

## 2023-11-17 DIAGNOSIS — D123 Benign neoplasm of transverse colon: Secondary | ICD-10-CM | POA: Diagnosis not present

## 2023-11-17 DIAGNOSIS — Z860101 Personal history of adenomatous and serrated colon polyps: Secondary | ICD-10-CM | POA: Diagnosis not present

## 2023-11-17 DIAGNOSIS — Z9889 Other specified postprocedural states: Secondary | ICD-10-CM

## 2023-11-17 DIAGNOSIS — Z85048 Personal history of other malignant neoplasm of rectum, rectosigmoid junction, and anus: Secondary | ICD-10-CM | POA: Diagnosis not present

## 2023-11-17 DIAGNOSIS — K5730 Diverticulosis of large intestine without perforation or abscess without bleeding: Secondary | ICD-10-CM

## 2023-11-17 DIAGNOSIS — Z1211 Encounter for screening for malignant neoplasm of colon: Secondary | ICD-10-CM | POA: Diagnosis not present

## 2023-11-17 DIAGNOSIS — D122 Benign neoplasm of ascending colon: Secondary | ICD-10-CM | POA: Diagnosis not present

## 2023-11-17 DIAGNOSIS — Z85038 Personal history of other malignant neoplasm of large intestine: Secondary | ICD-10-CM

## 2023-11-17 MED ORDER — SODIUM CHLORIDE 0.9 % IV SOLN: 500.0000 mL | INTRAVENOUS | Status: DC

## 2023-11-17 NOTE — Telephone Encounter (Signed)
 Notified admitting that patient would arrive at 2:30 instead of 3:00.

## 2023-11-17 NOTE — Progress Notes (Signed)
 [facility] History and Physical   Primary Care Physician:  Orpha Bur, MD   Reason for Procedure:   H/O Rectal Ca s/p neoadjuvant chemo-XRT/APR 2003 followed by pulmonary isolated met s/p wedge resection 2011. Colon 07/2022 withSignificant polyp (Bx- TA)  Plan:    colon through ostomy     HPI: Jon Wallace is a 78 y.o. male    Past Medical History:  Diagnosis Date   Cataract    RIGHT eye sx   Colon cancer (HCC)    2003   Lung cancer (HCC)    2011    Past Surgical History:  Procedure Laterality Date   COLON SURGERY  2003   permanent colostomy   COLON SURGERY  2021   colon sx to correct scar tissue/blockages   COLOSTOMY  2003   permanent colostomy placed due to colon CA   INGUINAL HERNIA REPAIR Left 1951   KNEE ARTHROSCOPY W/ MENISCAL REPAIR Left    TONSILLECTOMY     WISDOM TOOTH EXTRACTION      Prior to Admission medications   Medication Sig Start Date End Date Taking? Authorizing Provider  calcium carbonate (TUMS) 500 MG chewable tablet Chew 1 tablet by mouth daily as needed for indigestion or heartburn.   Yes [provider]  docusate sodium (STOOL SOFTENER) 100 MG capsule Take 100 mg by mouth 2 (two) times daily as needed for mild constipation.   Yes [provider]  acetaminophen (TYLENOL) 325 MG tablet Take 325 mg by mouth every 6 (six) hours as needed for mild pain. Patient not taking: Reported on 10/20/2023 04/23/19   [provider]    Current Outpatient Medications  Medication Sig Dispense Refill   calcium carbonate (TUMS) 500 MG chewable tablet Chew 1 tablet by mouth daily as needed for indigestion or heartburn.     docusate sodium (STOOL SOFTENER) 100 MG capsule Take 100 mg by mouth 2 (two) times daily as needed for mild constipation.     acetaminophen (TYLENOL) 325 MG tablet Take 325 mg by mouth every 6 (six) hours as needed for mild pain. (Patient not taking: Reported on 10/20/2023)     Current  Facility-Administered Medications  Medication Dose Route Frequency Provider Last Rate Last Admin   0.9 %  sodium chloride infusion  500 mL Intravenous Continuous Lynann Bologna, MD        Allergies as of 11/17/2023 - Review Complete 11/17/2023  Allergen Reaction Noted   Morphine and codeine Nausea And Vomiting 05/14/2012    Family History  Problem Relation Age of Onset   Colon polyps Father 40   COPD Father    Throat cancer Father 53   Throat cancer Sister 4   Thyroid cancer Sister 28       and jaw cancer   Colon cancer Paternal Uncle    Stomach cancer Cousin    Pancreatic cancer Neg Hx     Social History   Socioeconomic History   Marital status: Married    Spouse name: Marylou Mccoy   Number of children: 4   Years of education: Not on file   Highest education level: Not on file  Occupational History   Occupation: retired  Tobacco Use   Smoking status: Former   Smokeless tobacco: Never  Advertising account planner   Vaping status: Never Used  Substance and Sexual Activity   Alcohol use: Not Currently    Alcohol/week: 0.0 - 7.0 standard drinks of alcohol    Comment: occasionally   Drug use:  Not Currently   Sexual activity: Not on file  Other Topics Concern   Not on file  Social History Narrative   Not on file   Social Drivers of Health   Financial Resource Strain: Not on file  Food Insecurity: No Food Insecurity (09/05/2023)   Hunger Vital Sign    Worried About Running Out of Food in the Last Year: Never true    Ran Out of Food in the Last Year: Never true  Transportation Needs: No Transportation Needs (09/05/2023)   PRAPARE - Administrator, Civil Service (Medical): No    Lack of Transportation (Non-Medical): No  Physical Activity: Not on file  Stress: Not on file  Social Connections: Socially Isolated (09/05/2023)   Social Connection and Isolation Panel [NHANES]    Frequency of Communication with Friends and Family: Once a week    Frequency of Social Gatherings with  Friends and Family: Once a week    Attends Religious Services: Never    Database administrator or Organizations: No    Attends Banker Meetings: Never    Marital Status: Married  Catering manager Violence: Not At Risk (09/05/2023)   Humiliation, Afraid, Rape, and Kick questionnaire    Fear of Current or Ex-Partner: No    Emotionally Abused: No    Physically Abused: No    Sexually Abused: No    Review of Systems: Positive for none All other review of systems negative except as mentioned in the HPI.  Physical Exam: Vital signs in last 24 hours: @VSRANGES @   General:   Alert,  Well-developed, well-nourished, pleasant and cooperative in NAD Lungs:  Clear throughout to auscultation.   Heart:  Regular rate and rhythm; no murmurs, clicks, rubs,  or gallops. Abdomen:  Soft, nontender and nondistended. Normal bowel sounds.   ostomy Neuro/Psych:  Alert and cooperative. Normal mood and affect. A and O x 3    No significant changes were identified.  The patient continues to be an appropriate candidate for the planned procedure and anesthesia.   Magnus Schuller, MD. [facility] 11/17/2023 3:44 PM@

## 2023-11-17 NOTE — Progress Notes (Signed)
 Called to room to assist during endoscopic procedure.  Patient ID and intended procedure confirmed with present staff. Received instructions for my participation in the procedure from the performing physician.

## 2023-11-17 NOTE — Patient Instructions (Signed)
 Resume previous diet Continue present medications Handouts given for diverticulosis and polyps   YOU HAD AN ENDOSCOPIC PROCEDURE TODAY AT THE [facility]:   Refer to the procedure report that was given to you for any specific questions about what was found during the examination.  If the procedure report does not answer your questions, please call your gastroenterologist to clarify.  If you requested that your care partner not be given the details of your procedure findings, then the procedure report has been included in a sealed envelope for you to review at your convenience later.  YOU SHOULD EXPECT: Some feelings of bloating in the abdomen. Passage of more gas than usual.  Walking can help get rid of the air that was put into your GI tract during the procedure and reduce the bloating. If you had a lower endoscopy (such as a colonoscopy or flexible sigmoidoscopy) you may notice spotting of blood in your stool or on the toilet paper. If you underwent a bowel prep for your procedure, you may not have a normal bowel movement for a few days.  Please Note:  You might notice some irritation and congestion in your nose or some drainage.  This is from the oxygen used during your procedure.  There is no need for concern and it should clear up in a day or so.  SYMPTOMS TO REPORT IMMEDIATELY:  Following lower endoscopy (colonoscopy or flexible sigmoidoscopy):  Excessive amounts of blood in the stool  Significant tenderness or worsening of abdominal pains  Swelling of the abdomen that is new, acute  Fever of 100F or higher  For urgent or emergent issues, a gastroenterologist can be reached at any hour by calling [phone]. Do not use MyChart messaging for urgent concerns.    DIET:  We do recommend a small meal at first, but then you may proceed to your regular diet.  Drink plenty of fluids but you should avoid alcoholic beverages for 24 hours.  ACTIVITY:  You should plan to take it  easy for the rest of today and you should NOT DRIVE or use heavy machinery until tomorrow (because of the sedation medicines used during the test).    FOLLOW UP: Our staff will call the number listed on your records the next business day following your procedure.  We will call around 7:15- 8:00 am to check on you and address any questions or concerns that you may have regarding the information given to you following your procedure. If we do not reach you, we will leave a message.     If any biopsies were taken you will be contacted by phone or by letter within the next 1-3 weeks.  Please call us  at [phone] if you have not heard about the biopsies in 3 weeks.    SIGNATURES/CONFIDENTIALITY: You and/or your care partner have signed paperwork which will be entered into your electronic medical record.  These signatures attest to the fact that that the information above on your After Visit Summary has been reviewed and is understood.  Full responsibility of the confidentiality of this discharge information lies with you and/or your care-partner.

## 2023-11-17 NOTE — Progress Notes (Signed)
 Pt's states no medical or surgical changes since previsit or office visit.

## 2023-11-17 NOTE — Progress Notes (Signed)
 A/o x 3, VSS, gd SR's, pleased with anesthesia, report to RN

## 2023-11-17 NOTE — Telephone Encounter (Signed)
 Inbound call from patient stating that he has a procedure scheduled for today at 3:00 and received a voicemail to see if he could come in at 2:30. Patient states he will be here at 2:30 instead of 3.

## 2023-11-17 NOTE — Op Note (Signed)
 [facility] Patient Name: Jon Wallace Procedure Date: 11/17/2023 3:40 PM MRN: [id number] Endoscopist: Lynann Bologna , MD, [id number] Age: 78 Referring MD:  Date of Birth: March 07, 1946 Gender: Male Account #: [id number] Procedure:                Colonoscopy Indications:              High risk colon cancer surveillance: H/O Rectal Ca                            s/p neoadjuvant chemo-XRT/APR 2003 followed by                            pulmonary isolated met s/p wedge resection 2011.                            Colon 07/2022 with significant polyp (Bx- TA) Medicines:                Monitored Anesthesia Care Procedure:                Pre-Anesthesia Assessment:                           - Prior to the procedure, a History and Physical                            was performed, and patient medications and                            allergies were reviewed. The patient's tolerance of                            previous anesthesia was also reviewed. The risks                            and benefits of the procedure and the sedation                            options and risks were discussed with the patient.                            All questions were answered, and informed consent                            was obtained. Prior Anticoagulants: The patient has                            taken no anticoagulant or antiplatelet agents. ASA                            Grade Assessment: III - A patient with severe                            systemic disease. After reviewing the risks and  benefits, the patient was deemed in satisfactory                            condition to undergo the procedure.                           After obtaining informed consent, the colonoscope                            was passed under direct vision. Throughout the                            procedure, the patient's blood pressure, pulse, and                            oxygen saturations  were monitored continuously. The                            Olympus Scope SN: [id number] was introduced through                            the colostomy and advanced to the the cecum,                            identified by appendiceal orifice and ileocecal                            valve. The colonoscopy was performed without                            difficulty. The patient tolerated the procedure                            well. The quality of the bowel preparation was                            good. The ileocecal valve, appendiceal orifice, and                            rectum were photographed. Scope In: 3:53:20 PM Scope Out: 4:11:32 PM Scope Withdrawal Time: 0 hours 14 minutes 34 seconds  Total Procedure Duration: 0 hours 18 minutes 12 seconds  Findings:                 Four sessile polyps were found in the mid                            transverse colon (2) and mid ascending colon(2).                            The polyps were 5 to 6 mm in size. These polyps                            were removed with a cold snare. Resection and  retrieval were complete.                           A few small-mouthed diverticula were found in the                            ascending colon.                           A tattoo was seen at the hepatic flexure. The                            tattoo site appeared normal. No recurrence of polyp                            in that area.                           The exam was otherwise without abnormality. Complications:            No immediate complications. Estimated Blood Loss:     Estimated blood loss: none. Impression:               - Small colonic polyps s/p polypectomy.                           - Mild sigmoid diverticulosis.                           - A tattoo was seen at the hepatic flexure. The                            tattoo site appeared normal.                           - The examination was otherwise normal  through the                            ostomy. Small peri-ostomy hernia. Recommendation:           - Patient has a contact number available for                            emergencies. The signs and symptoms of potential                            delayed complications were discussed with the                            patient. Return to normal activities tomorrow.                            Written discharge instructions were provided to the                            patient.                           -  Resume previous diet.                           - Continue present medications.                           - Await pathology results.                           - Repeat colonoscopy is not recommended for                            surveillance.                           - The findings and recommendations were discussed                            with the patient's family. Lajuan Pila, MD 11/17/2023 4:19:08 PM This report has been signed electronically.

## 2023-11-18 ENCOUNTER — Telehealth: Payer: Self-pay

## 2023-11-18 NOTE — Telephone Encounter (Signed)
 Attempted f/u call. No answer, left VM.

## 2023-11-23 LAB — SURGICAL PATHOLOGY

## 2023-11-28 ENCOUNTER — Encounter: Payer: Self-pay | Admitting: Gastroenterology

## 2023-12-01 DIAGNOSIS — E7800 Pure hypercholesterolemia, unspecified: Secondary | ICD-10-CM | POA: Diagnosis not present

## 2023-12-01 DIAGNOSIS — Z85048 Personal history of other malignant neoplasm of rectum, rectosigmoid junction, and anus: Secondary | ICD-10-CM | POA: Diagnosis not present

## 2023-12-22 DIAGNOSIS — Z87891 Personal history of nicotine dependence: Secondary | ICD-10-CM | POA: Diagnosis not present

## 2023-12-22 DIAGNOSIS — Z136 Encounter for screening for cardiovascular disorders: Secondary | ICD-10-CM | POA: Diagnosis not present

## 2024-01-01 DIAGNOSIS — Z85048 Personal history of other malignant neoplasm of rectum, rectosigmoid junction, and anus: Secondary | ICD-10-CM | POA: Diagnosis not present

## 2024-01-01 DIAGNOSIS — E7800 Pure hypercholesterolemia, unspecified: Secondary | ICD-10-CM | POA: Diagnosis not present

## 2024-01-31 DIAGNOSIS — E7800 Pure hypercholesterolemia, unspecified: Secondary | ICD-10-CM | POA: Diagnosis not present

## 2024-01-31 DIAGNOSIS — Z85048 Personal history of other malignant neoplasm of rectum, rectosigmoid junction, and anus: Secondary | ICD-10-CM | POA: Diagnosis not present

## 2024-03-02 DIAGNOSIS — Z85048 Personal history of other malignant neoplasm of rectum, rectosigmoid junction, and anus: Secondary | ICD-10-CM | POA: Diagnosis not present

## 2024-03-02 DIAGNOSIS — E7800 Pure hypercholesterolemia, unspecified: Secondary | ICD-10-CM | POA: Diagnosis not present

## 2024-03-28 ENCOUNTER — Encounter: Payer: Self-pay | Admitting: Gastroenterology

## 2024-03-28 DIAGNOSIS — R109 Unspecified abdominal pain: Secondary | ICD-10-CM

## 2024-03-28 DIAGNOSIS — K56600 Partial intestinal obstruction, unspecified as to cause: Secondary | ICD-10-CM

## 2024-03-28 MED ORDER — TRAMADOL-ACETAMINOPHEN 37.5-325 MG PO TABS: 1.0000 | ORAL_TABLET | Freq: Three times a day (TID) | ORAL | 0 refills | Status: AC | PRN

## 2024-03-28 NOTE — Telephone Encounter (Signed)
 I reviewed PDMP and patient was last given tramadol  February 2024 by Dr. Charlanne for history of rectal cancer with continuing rectal pain.  Patient still has a few pills left but does not want to run out.  No other providers are prescribing any controlled substance, very low risk for abuse.   Refilled this for the patient.

## 2024-04-02 DIAGNOSIS — Z85048 Personal history of other malignant neoplasm of rectum, rectosigmoid junction, and anus: Secondary | ICD-10-CM | POA: Diagnosis not present

## 2024-04-02 DIAGNOSIS — E7800 Pure hypercholesterolemia, unspecified: Secondary | ICD-10-CM | POA: Diagnosis not present

## 2024-05-02 DIAGNOSIS — E7800 Pure hypercholesterolemia, unspecified: Secondary | ICD-10-CM | POA: Diagnosis not present

## 2024-05-02 DIAGNOSIS — Z85048 Personal history of other malignant neoplasm of rectum, rectosigmoid junction, and anus: Secondary | ICD-10-CM | POA: Diagnosis not present

## 2024-06-02 DIAGNOSIS — Z85048 Personal history of other malignant neoplasm of rectum, rectosigmoid junction, and anus: Secondary | ICD-10-CM | POA: Diagnosis not present

## 2024-06-02 DIAGNOSIS — E7800 Pure hypercholesterolemia, unspecified: Secondary | ICD-10-CM | POA: Diagnosis not present

## 2024-06-28 DIAGNOSIS — Z6821 Body mass index (BMI) 21.0-21.9, adult: Secondary | ICD-10-CM | POA: Diagnosis not present

## 2024-06-28 DIAGNOSIS — Z23 Encounter for immunization: Secondary | ICD-10-CM | POA: Diagnosis not present

## 2024-06-28 DIAGNOSIS — M7042 Prepatellar bursitis, left knee: Secondary | ICD-10-CM | POA: Diagnosis not present
